# Patient Record
Sex: Male | Born: 1955 | Hispanic: No | Marital: Married | Smoking: Never smoker
Health system: Southern US, Community
[De-identification: ages and names within clinical notes are randomized; demographics above are authoritative.]

## PROBLEM LIST (undated history)

## (undated) DIAGNOSIS — B72 Dracunculiasis: Secondary | ICD-10-CM

## (undated) DIAGNOSIS — I10 Essential (primary) hypertension: Secondary | ICD-10-CM

## (undated) DIAGNOSIS — M545 Low back pain, unspecified: Secondary | ICD-10-CM

## (undated) HISTORY — DX: Essential (primary) hypertension: I10

## (undated) HISTORY — DX: Low back pain: M54.5

## (undated) HISTORY — DX: Low back pain, unspecified: M54.50

## (undated) HISTORY — DX: Dracunculiasis: B72

---

## 1993-09-10 HISTORY — PX: BREAST SURGERY: SHX581

## 2005-09-10 HISTORY — PX: APPENDECTOMY: SHX54

## 2006-10-05 ENCOUNTER — Ambulatory Visit (HOSPITAL_COMMUNITY): Admission: EM | Admit: 2006-10-05 | Discharge: 2006-10-06 | Payer: Self-pay | Admitting: Emergency Medicine

## 2011-10-15 ENCOUNTER — Encounter (HOSPITAL_COMMUNITY): Payer: Self-pay | Admitting: *Deleted

## 2011-10-15 ENCOUNTER — Emergency Department (HOSPITAL_COMMUNITY)
Admission: EM | Admit: 2011-10-15 | Discharge: 2011-10-15 | Disposition: A | Discharge status: Home / Self Care | Payer: Self-pay | Attending: Emergency Medicine | Admitting: Emergency Medicine

## 2011-10-15 ENCOUNTER — Emergency Department (HOSPITAL_COMMUNITY): Payer: Self-pay

## 2011-10-15 DIAGNOSIS — M25519 Pain in unspecified shoulder: Secondary | ICD-10-CM | POA: Insufficient documentation

## 2011-10-15 DIAGNOSIS — M545 Low back pain, unspecified: Secondary | ICD-10-CM | POA: Insufficient documentation

## 2011-10-15 DIAGNOSIS — M5380 Other specified dorsopathies, site unspecified: Secondary | ICD-10-CM | POA: Insufficient documentation

## 2011-10-15 MED ORDER — HYDROCODONE-ACETAMINOPHEN 5-325 MG PO TABS: 2.0000 | ORAL_TABLET | Freq: Every evening | ORAL | Status: AC | PRN

## 2011-10-15 MED ORDER — IBUPROFEN 800 MG PO TABS
800.0000 mg | ORAL_TABLET | Freq: Once | ORAL | Status: AC
  Administered 2011-10-15: 800 mg via ORAL
  Filled 2011-10-15: qty 1

## 2011-10-15 MED ORDER — IBUPROFEN 600 MG PO TABS: 600.0000 mg | ORAL_TABLET | Freq: Four times a day (QID) | ORAL | Status: AC | PRN

## 2011-10-15 NOTE — ED Provider Notes (Signed)
History     CSN: [id number]  Arrival date & time 10/15/11  1146   First MD Initiated Contact with Patient 10/15/11 1209      Chief Complaint  Patient presents with  . Shoulder Pain  . Back Pain    (Consider location/radiation/quality/duration/timing/severity/associated sxs/prior treatment) HPI  Patient presents to the emergency department with complaints of left shoulder pain since September and lumbar pain since September. The patient denies any injury or history of shoulder and lower back problems. The patient states that it does not hurt all the time but it mainly hurts at night when he is trying to go to sleep. Patient was given 600 mg of ibuprofen in the ED and upon my examination he states that the pain is mostly gone. The patient has not tried anything at home prior to arrival. The patient denies weakness, loss of sensation, inability to feel in both arms. Patient denies neck pain, bowel incontinence and urinary incontinence.  History reviewed. No pertinent past medical history.  Past Surgical History  Procedure Date  . Breast surgery   . Appendectomy     No family history on file.  History  Substance Use Topics  . Smoking status: Never Smoker   . Smokeless tobacco: Not on file  . Alcohol Use: No      Review of Systems  All other systems reviewed and are negative.    Allergies  Review of patient's allergies indicates no known allergies.  Home Medications   Current Outpatient Rx  Name Route Sig Dispense Refill  . NAPROXEN SODIUM 220 MG PO TABS Oral Take 220 mg by mouth 2 (two) times daily with a meal. pain    . HYDROCODONE-ACETAMINOPHEN 5-325 MG PO TABS Oral Take 2 tablets by mouth at bedtime as needed for pain. 6 tablet 0  . IBUPROFEN 600 MG PO TABS Oral Take 1 tablet (600 mg total) by mouth every 6 (six) hours as needed for pain. 30 tablet 0    BP 137/85  Pulse 61  Temp(Src) 98.2 F (36.8 C) (Oral)  Resp 16  SpO2 100%  Physical Exam    Constitutional: He is oriented to person, place, and time. He appears well-developed and well-nourished.  HENT:  Head: Normocephalic and atraumatic.  Eyes: EOM are normal. Pupils are equal, round, and reactive to light.  Neck: Normal range of motion.  Cardiovascular: Normal rate and regular rhythm.   Pulmonary/Chest: Effort normal and breath sounds normal.  Musculoskeletal: He exhibits tenderness.       Left shoulder: He exhibits decreased range of motion (due to pain) and pain. He exhibits no tenderness, no bony tenderness, no swelling, no effusion, no crepitus, no deformity, no laceration, no spasm, normal pulse and normal strength.       Lumbar back: He exhibits spasm. He exhibits normal range of motion, no tenderness, no bony tenderness, no swelling, no edema, no deformity, no laceration, no pain and normal pulse.  Neurological: He is alert and oriented to person, place, and time.  Skin: Skin is warm and dry.    ED Course  Procedures (including critical care time)  Labs Reviewed - No data to display Dg Lumbar Spine Complete  10/15/2011  *RADIOLOGY REPORT*  Clinical Data: Low back pain  LUMBAR SPINE - COMPLETE 4+ VIEW  Comparison: None  Findings: Osseous demineralization. Five non-rib bearing lumbar vertebrae. Scattered end plate spur formation and minimal disc space narrowing. No acute fracture, subluxation or bone destruction. No spondylolysis. SI joints symmetric. Subcutaneous soft  tissue calcification identified at the lower left lumbar region.  IMPRESSION: Mild scattered degenerative disc disease changes lumbar spine. No acute abnormalities.  Original Report Authenticated By: Lollie Marrow, M.D.   Dg Shoulder Left  10/15/2011  *RADIOLOGY REPORT*  Clinical Data: Low back pain, left shoulder pain  LEFT SHOULDER - 2+ VIEW  Comparison: None  Findings: AC joint alignment normal. Osseous demineralization. No acute fracture, dislocation, or bone destruction. Visualized left ribs intact.   IMPRESSION: Osseous demineralization. No acute abnormalities.  Original Report Authenticated By: Lollie Marrow, M.D.     1. Shoulder pain   2. Lumbar pain       MDM  Pts symptoms most likely due to musculloskeletal pain and arthritis. Will give patient a referral to Ortho and Motrin for during the pain and Lortab for at night.        Dorthula Matas, PA 10/15/11 [phone]

## 2011-10-15 NOTE — ED Notes (Signed)
Pt reports left shoulder pain since September. Denies known injury.

## 2011-10-16 NOTE — ED Provider Notes (Signed)
Medical screening examination/treatment/procedure(s) were performed by non-physician practitioner and as supervising physician I was immediately available for consultation/collaboration.   Laray Anger, DO 10/16/11 1003

## 2012-09-03 IMAGING — CR DG SHOULDER 2+V*L*
3 series · 3 of 3 positions shown · non-contrast
Comparison: None

CLINICAL DATA: Low back pain, left shoulder pain

LEFT SHOULDER - 2+ VIEW

[w shoulder external left]
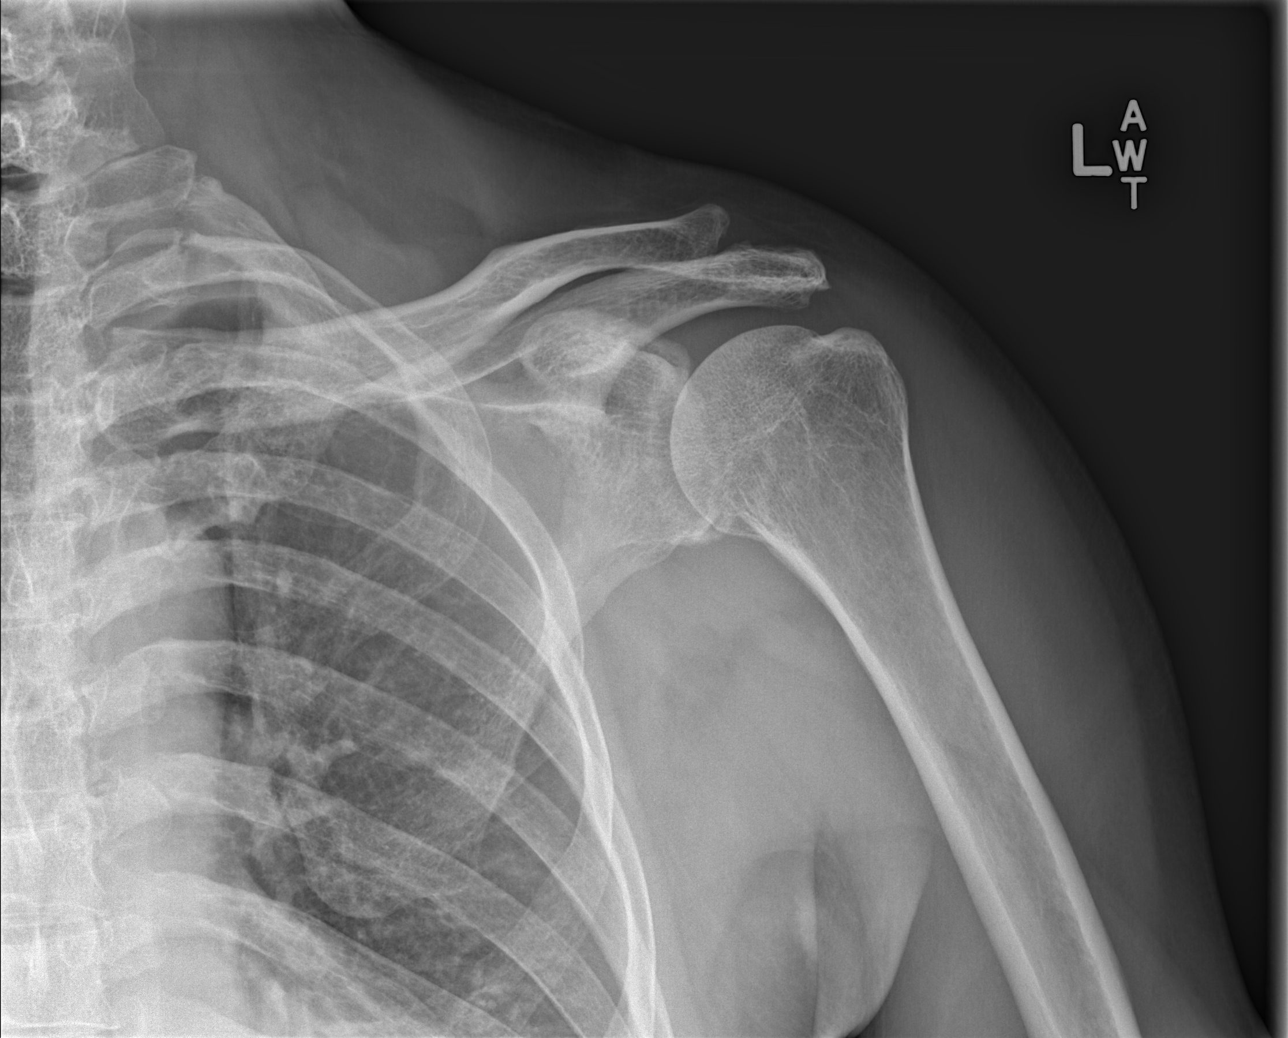

[w shoulder y-view left (1 of 2)]
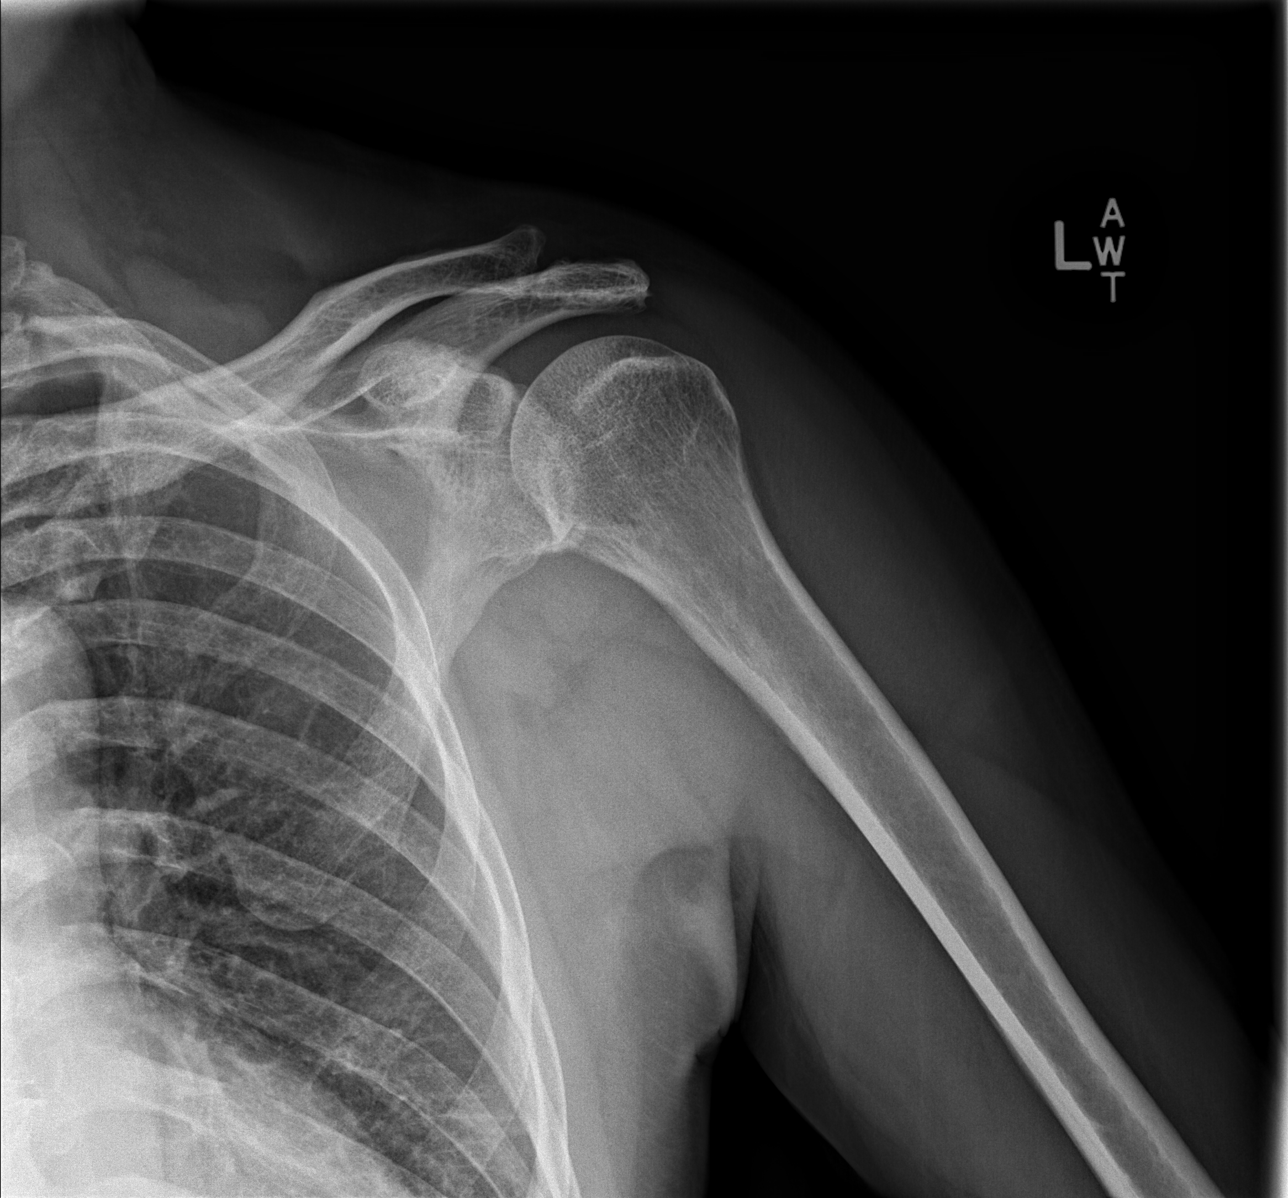

[w shoulder y-view left (2 of 2)]
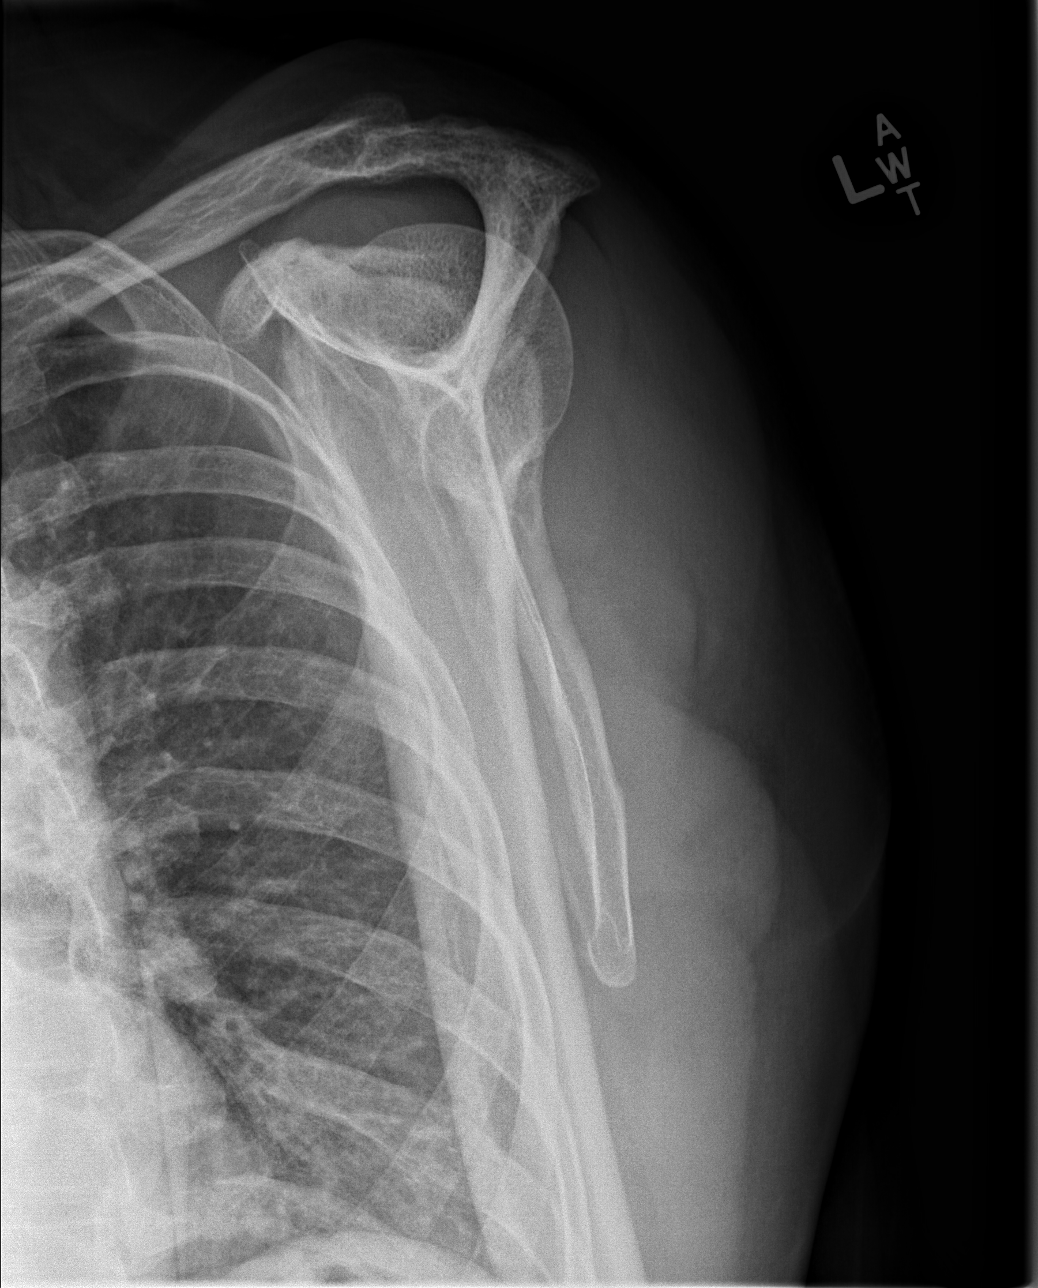

[3 of 3 positions shown; findings below may reference images not displayed]

FINDINGS: AC joint alignment normal.
Osseous demineralization.
No acute fracture, dislocation, or bone destruction.
Visualized left ribs intact.
IMPRESSION: Osseous demineralization.
No acute abnormalities.

## 2013-02-07 ENCOUNTER — Encounter (HOSPITAL_COMMUNITY): Payer: Self-pay | Admitting: Emergency Medicine

## 2013-02-07 ENCOUNTER — Emergency Department (HOSPITAL_COMMUNITY)
Admission: EM | Admit: 2013-02-07 | Discharge: 2013-02-07 | Disposition: A | Discharge status: Home / Self Care | Payer: Self-pay | Source: Home / Self Care

## 2013-02-07 DIAGNOSIS — J309 Allergic rhinitis, unspecified: Secondary | ICD-10-CM

## 2013-02-07 DIAGNOSIS — J020 Streptococcal pharyngitis: Secondary | ICD-10-CM

## 2013-02-07 LAB — POCT RAPID STREP A: Streptococcus, Group A Screen (Direct): POSITIVE — AB

## 2013-02-07 MED ORDER — FEXOFENADINE HCL 180 MG PO TABS: 180.0000 mg | ORAL_TABLET | Freq: Every day | ORAL | Status: DC

## 2013-02-07 MED ORDER — AMOXICILLIN 500 MG PO CAPS: 500.0000 mg | ORAL_CAPSULE | Freq: Three times a day (TID) | ORAL | Status: DC

## 2013-02-07 NOTE — ED Notes (Signed)
Pt c/o joint pain and weakness, headache, dry nonproductive cough.  Symptoms started on Friday. Pt denies fever, n/v/d.  Pt has been taking benedryl with no relief in symptoms.

## 2013-02-07 NOTE — ED Provider Notes (Signed)
History     CSN: [id number]  Arrival date & time 02/07/13  1146   None     Chief Complaint  Patient presents with  . Joint Pain    joint pain and fatigue    (Consider location/radiation/quality/duration/timing/severity/associated sxs/prior treatment) HPI Comments: 57 year old male presents with complaints of headache, sore throat, upper respiratory/ nasal congestion, PND, or and a weakness in the muscles and joints. He denies joint pain or swelling. Denies fever, chills, rash or tick bites that anytime. Symptoms began yesterday morning.   History reviewed. No pertinent past medical history.  Past Surgical History  Procedure Laterality Date  . Breast surgery    . Appendectomy      History reviewed. No pertinent family history.  History  Substance Use Topics  . Smoking status: Never Smoker   . Smokeless tobacco: Not on file  . Alcohol Use: No      Review of Systems  Constitutional: Positive for activity change and fatigue. Negative for fever, diaphoresis and appetite change.  HENT: Positive for congestion, sore throat, rhinorrhea and postnasal drip. Negative for ear pain, facial swelling, trouble swallowing, neck pain and neck stiffness.   Eyes: Negative for pain, discharge and redness.  Respiratory: Positive for cough. Negative for chest tightness and shortness of breath.   Cardiovascular: Negative.   Gastrointestinal: Negative.   Musculoskeletal: Negative.   Skin: Negative.  Negative for rash.  Neurological: Negative.     Allergies  Review of patient's allergies indicates no known allergies.  Home Medications   Current Outpatient Rx  Name  Route  Sig  Dispense  Refill  . amoxicillin (AMOXIL) 500 MG capsule   Oral   Take 1 capsule (500 mg total) by mouth 3 (three) times daily.   21 capsule   0   . fexofenadine (ALLEGRA) 180 MG tablet   Oral   Take 1 tablet (180 mg total) by mouth daily.   14 tablet   0   . naproxen sodium (ANAPROX) 220 MG tablet  Oral   Take 220 mg by mouth 2 (two) times daily with a meal. pain           BP 124/75  Pulse 67  Temp(Src) 98.7 F (37.1 C) (Oral)  Resp 18  SpO2 100%  Physical Exam  Nursing note and vitals reviewed. Constitutional: He is oriented to person, place, and time. He appears well-developed and well-nourished. No distress.  HENT:  Bilateral TMs are normal with the exception of mild retraction of the left TM. Oropharynx mildly red with clear PND. No exudates.  Eyes: Conjunctivae and EOM are normal.  Neck: Normal range of motion. Neck supple.  Cardiovascular: Normal rate, regular rhythm and normal heart sounds.   No murmur heard. Pulmonary/Chest: Effort normal and breath sounds normal. No respiratory distress. He has no wheezes. He has no rales.  Abdominal: Soft. There is no tenderness.  Musculoskeletal: Normal range of motion. He exhibits no edema.  No joint tenderness. No joint swelling.  Lymphadenopathy:    He has no cervical adenopathy.  Neurological: He is alert and oriented to person, place, and time. He exhibits normal muscle tone.  Skin: Skin is warm and dry. No rash noted.  Psychiatric: He has a normal mood and affect.    ED Course  Procedures (including critical care time)  Labs Reviewed  POCT RAPID STREP A ([facility] CARE ONLY) - Abnormal; Notable for the following:    Streptococcus, Group A Screen (Direct) POSITIVE (*)  All other components within normal limits   No results found.   1. Strep pharyngitis   2. Allergic rhinitis due to allergen       MDM  Amoxicillin 500 mg 3 times a day for 7 days Use Cepacol lozenges or Chloraseptic spray for discomfort Ibuprofen 600 mg every 6 hours when necessary pain Allegra 180 mg daily for allergies Drink plenty of fluids and stay well hydrated. Recheck promptly for any new symptoms problems or worsening. May follow up with your PCP next week if needed.       Hayden Rasmussen, NP 02/07/13 1301

## 2013-02-08 NOTE — ED Provider Notes (Signed)
Medical screening examination/treatment/procedure(s) were performed by resident physician or non-physician practitioner and as supervising physician I was immediately available for consultation/collaboration.   Lyfe Monger DOUGLAS MD.   Arrion Broaddus D Mikahla Wisor, MD 02/08/13 1844 

## 2014-09-10 DIAGNOSIS — R7303 Prediabetes: Secondary | ICD-10-CM | POA: Insufficient documentation

## 2014-09-10 HISTORY — DX: Prediabetes: R73.03

## 2014-09-20 ENCOUNTER — Ambulatory Visit: Payer: Self-pay | Admitting: Internal Medicine

## 2014-09-28 ENCOUNTER — Encounter: Payer: Self-pay | Admitting: Internal Medicine

## 2014-09-28 ENCOUNTER — Ambulatory Visit: Payer: Self-pay | Attending: Internal Medicine | Admitting: Internal Medicine

## 2014-09-28 VITALS — BP 138/81 | HR 54 | Temp 98.2°F | Resp 16 | Ht 71.0 in | Wt 176.0 lb

## 2014-09-28 DIAGNOSIS — M545 Low back pain: Secondary | ICD-10-CM | POA: Insufficient documentation

## 2014-09-28 DIAGNOSIS — R5383 Other fatigue: Secondary | ICD-10-CM | POA: Insufficient documentation

## 2014-09-28 DIAGNOSIS — R1084 Generalized abdominal pain: Secondary | ICD-10-CM

## 2014-09-28 DIAGNOSIS — Z2821 Immunization not carried out because of patient refusal: Secondary | ICD-10-CM

## 2014-09-28 MED ORDER — IBUPROFEN 600 MG PO TABS: 600.0000 mg | ORAL_TABLET | Freq: Three times a day (TID) | ORAL | Status: DC | PRN

## 2014-09-28 NOTE — Progress Notes (Signed)
Pt is here to establish care. Pt wants to address his occasional sharp/burning pain  in his back and ribs. He states that the pain sometimes makes it hard to breath.

## 2014-09-28 NOTE — Patient Instructions (Signed)

## 2014-09-28 NOTE — Progress Notes (Signed)
Patient ID: Joshua Calhoun Kucinski, male   DOB: 12-02-1955, 59 y.o.   MRN: [id number]  HQI:[id number]:[id number]  XLK:[id number]  DOB - 12-02-1955  CC:  Chief Complaint  Patient presents with  . Establish Care       HPI: Joshua Calhoun Rude is a 59 y.o. male here today to establish medical care.  Patient has no past medical history. He reports chronic back pain since the age of 59.  He denies any previous injury. The pain is located around his waist, more severe on the left side.  He states that when the pain is severe it radiates to his abdomen, up his back, and up the ribs that is aggravated by deep inspiration and bending over.  The pain is is achy and burning in nature.It  He denies nausea, vomiting, bowel/bladder dysfunction, fever, dysuria.   No Known Allergies History reviewed. No pertinent past medical history. Current Outpatient Prescriptions on File Prior to Visit  Medication Sig Dispense Refill  . naproxen sodium (ANAPROX) 220 MG tablet Take 220 mg by mouth 2 (two) times daily with a meal. pain    . amoxicillin (AMOXIL) 500 MG capsule Take 1 capsule (500 mg total) by mouth 3 (three) times daily. (Patient not taking: Reported on 09/28/2014) 21 capsule 0  . fexofenadine (ALLEGRA) 180 MG tablet Take 1 tablet (180 mg total) by mouth daily. (Patient not taking: Reported on 09/28/2014) 14 tablet 0   No current facility-administered medications on file prior to visit.   History reviewed. No pertinent family history. History   Social History  . Marital Status: Single    Spouse Name: N/A    Number of Children: N/A  . Years of Education: N/A   Occupational History  . Not on file.   Social History Main Topics  . Smoking status: Never Smoker   . Smokeless tobacco: Not on file  . Alcohol Use: No  . Drug Use: No  . Sexual Activity: Yes    Birth Control/ Protection: Condom   Other Topics Concern  . Not on file   Social History Narrative    Review of Systems  HENT: Negative.   Eyes:  Negative.   Respiratory: Negative.   Cardiovascular: Negative.   Gastrointestinal: Positive for abdominal pain. Negative for heartburn, nausea, vomiting and constipation.  Musculoskeletal: Positive for back pain.  Neurological: Negative.   Psychiatric/Behavioral: Negative.       Objective:   Filed Vitals:   09/28/14 1414  BP: 138/81  Pulse: 54  Temp: 98.2 F (36.8 C)  Resp: 16    Physical Exam  Constitutional: He is oriented to person, place, and time.  Cardiovascular: Normal rate, regular rhythm and normal heart sounds.   Pulmonary/Chest: Effort normal and breath sounds normal.  Abdominal: Soft. Bowel sounds are normal. He exhibits no distension. There is no tenderness.  Musculoskeletal: Normal range of motion. He exhibits tenderness (left paraspinal and waist).  Neurological: He is alert and oriented to person, place, and time.  Skin: Skin is warm and dry.   .  No results found for: WBC, HGB, HCT, MCV, PLT No results found for: CREATININE, BUN, NA, K, CL, CO2  No results found for: HGBA1C Lipid Panel  No results found for: CHOL, TRIG, HDL, CHOLHDL, VLDL, LDLCALC     Assessment and plan:   Thelma BargeFrancis was seen today for establish care.  Diagnoses and associated orders for this visit:  Low back pain without sciatica, unspecified back pain laterality/Generalized abdominal pain - ibuprofen (ADVIL,MOTRIN) 600 MG tablet;  Take 1 tablet (600 mg total) by mouth every 8 (eight) hours as needed. If pain persist we will do lumbar xray Explained signs and symptoms that should warrant immediate attention.  Patient verbalized understanding with teach back used.  Other fatigue - Lipid panel; Future - CBC; Future - COMPLETE METABOLIC PANEL WITH GFR; Future - TSH; Future - Hemoglobin A1C; Future  Refused influenza vaccine Explained that annual influenza is recommended per CDC guidelines and is highly suggested to anyone who has has CHF, COPD, DM or immunocompromised. Benefits  of influenza described in detail.    Return soon for blood work.    Holland Commons, NP-C [facility] [phone] 09/28/2014, 2:22 PM

## 2014-10-04 ENCOUNTER — Ambulatory Visit: Payer: No Typology Code available for payment source | Attending: Internal Medicine

## 2014-10-04 DIAGNOSIS — R5383 Other fatigue: Secondary | ICD-10-CM

## 2014-10-04 LAB — COMPLETE METABOLIC PANEL WITH GFR
ALK PHOS: 66 U/L (ref 39–117)
ALT: 14 U/L (ref 0–53)
AST: 20 U/L (ref 0–37)
Albumin: 4.1 g/dL (ref 3.5–5.2)
BUN: 14 mg/dL (ref 6–23)
CO2: 27 mEq/L (ref 19–32)
Calcium: 9.3 mg/dL (ref 8.4–10.5)
Chloride: 107 mEq/L (ref 96–112)
Creat: 1.05 mg/dL (ref 0.50–1.35)
GFR, Est African American: >89 mL/min
GFR, Est Non African American: 78 mL/min
Glucose, Bld: 103 mg/dL — ABNORMAL HIGH (ref 70–99)
Potassium: 5.4 mEq/L — ABNORMAL HIGH (ref 3.5–5.3)
SODIUM: 146 meq/L — AB (ref 135–145)
Total Bilirubin: 1.1 mg/dL (ref 0.2–1.2)
Total Protein: 6.9 g/dL (ref 6.0–8.3)

## 2014-10-04 LAB — LIPID PANEL
CHOL/HDL RATIO: 3.3 ratio
CHOLESTEROL: 173 mg/dL (ref 0–200)
HDL: 53 mg/dL (ref >39)
LDL CALC: 104 mg/dL — AB (ref 0–99)
TRIGLYCERIDES: 78 mg/dL (ref <150)
VLDL: 16 mg/dL (ref 0–40)

## 2014-10-04 LAB — CBC
HEMATOCRIT: 42.7 % (ref 39.0–52.0)
Hemoglobin: 13.6 g/dL (ref 13.0–17.0)
MCH: 29.6 pg (ref 26.0–34.0)
MCHC: 31.9 g/dL (ref 30.0–36.0)
MCV: 93 fL (ref 78.0–100.0)
MPV: 9.8 fL (ref 8.6–12.4)
Platelets: 235 10*3/uL (ref 150–400)
RBC: 4.59 MIL/uL (ref 4.22–5.81)
RDW: 13.2 % (ref 11.5–15.5)
WBC: 3.8 10*3/uL — ABNORMAL LOW (ref 4.0–10.5)

## 2014-10-04 LAB — TSH: TSH: 1.303 u[IU]/mL (ref 0.350–4.500)

## 2014-10-04 LAB — HEMOGLOBIN A1C
Hgb A1c MFr Bld: 5.9 % — ABNORMAL HIGH (ref <5.7)
MEAN PLASMA GLUCOSE: 123 mg/dL — AB (ref <117)

## 2014-10-12 ENCOUNTER — Telehealth: Payer: Self-pay | Admitting: Emergency Medicine

## 2014-10-12 ENCOUNTER — Telehealth: Payer: Self-pay | Admitting: Internal Medicine

## 2014-10-12 NOTE — Telephone Encounter (Signed)
Pt is in waiting room, he is here to inquire about her results.

## 2014-10-12 NOTE — Telephone Encounter (Signed)
Pt requesting lab results Please post

## 2014-10-14 ENCOUNTER — Telehealth: Payer: Self-pay | Admitting: Emergency Medicine

## 2014-10-14 NOTE — Telephone Encounter (Signed)
Left message for pt to call for lab results 

## 2014-10-15 ENCOUNTER — Telehealth: Payer: Self-pay | Admitting: Emergency Medicine

## 2014-10-15 NOTE — Telephone Encounter (Signed)
Pt given lab results with diet/exercise education

## 2015-06-20 ENCOUNTER — Encounter: Payer: Self-pay | Admitting: Internal Medicine

## 2015-06-20 ENCOUNTER — Ambulatory Visit (INDEPENDENT_AMBULATORY_CARE_PROVIDER_SITE_OTHER): Payer: Self-pay | Admitting: Internal Medicine

## 2015-06-20 VITALS — BP 128/84 | HR 72 | Ht 69.0 in | Wt 180.0 lb

## 2015-06-20 DIAGNOSIS — B86 Scabies: Secondary | ICD-10-CM

## 2015-06-20 MED ORDER — PERMETHRIN 5 % EX CREA: TOPICAL_CREAM | CUTANEOUS | Status: DC

## 2015-06-20 MED ORDER — TRIAMCINOLONE ACETONIDE 0.1 % EX CREA
TOPICAL_CREAM | CUTANEOUS | Status: DC
Start: 2015-06-20 — End: 2015-07-18

## 2015-06-20 NOTE — Patient Instructions (Addendum)
At bedtime, after obtaining medication: Cover skin from top of head to bottoms of feet with lotion. In morning:  Wash clothes in hot water that have been worn since rash, sheets, blankets and other bedclothes.  Dry in dryer on high. Then shower off medication. Repeat again in 1 week with lotion and washing bedclothes/clothes. Call [phone] and see if can be seen at [facility] STD clinic to get this.

## 2015-06-20 NOTE — Progress Notes (Signed)
   Subjective:    Patient ID: Joshua Calhoun, male    DOB: Aug 03, 1956, 59 y.o.   MRN: [id number]  HPI  States had non pruritic rash all over his body in 2010/2011.  Ultimately, seen by Dermatologist within [facility] system.  Reportedly, no definitive diagnosis even with biopsies of skin.   Was treated with unknown cream from [facility] at [facility]  States he used the cream twice daily for about 1 month and the rash resolved. Unable to find anything regarding this in patient's chart.  Pt. Started with a different rash on forearms in August of this year.  This rash is pruritic.  Starts as little bumps and then scratches.  Has hyperpigmented areas where he has scratched lesions in past. Now on upper back, arms and left leg--mainly lower leg. Has been using anti itch cream--"Triple - Action".  No real improvement. Has itching in webbing of hands, but has not noted any lesions. LIves alone.  Denies sleeping/having intercourse with anyone since 2007. 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 No associated fever or other concern.      Review of Systems     Objective:   Physical Exam  Constitutional: He appears well-developed and well-nourished.  Eyes: EOM are normal. Pupils are equal, round, and reactive to light.  Neck: Neck supple.  No cervical adenopathy  Cardiovascular: Normal rate and regular rhythm.  Exam reveals no friction rub.   Abdominal: Soft. Bowel sounds are normal. He exhibits no mass.  No HSM  Musculoskeletal: Normal range of motion.  Skin: Skin is warm and dry.  Papular rash with newer lesions, possible some burrows, but difficult to tell as has scratched most of the older lesions until significant scarring and hyperpigmentation. Lesions most prominent on forearms and entire length of legs, possibly some resolving lesions in interdigital areas. Also noted  on upper back and at anterior waistline.          Assessment & Plan:  1.  Possible scabies: Unable to get patient affordable Rx for Permethrin 5% topical at local pharmacies.  Spoke with PHD.  Pt. Can be seen in STD clinic for fill of medication for free/low cost. Pt. Sent with instructions to PHD to have this done.

## 2015-07-18 ENCOUNTER — Encounter: Payer: Self-pay | Admitting: Internal Medicine

## 2015-07-18 ENCOUNTER — Ambulatory Visit (INDEPENDENT_AMBULATORY_CARE_PROVIDER_SITE_OTHER): Payer: Self-pay | Admitting: Internal Medicine

## 2015-07-18 VITALS — BP 138/88 | HR 60 | Ht 69.0 in | Wt 181.0 lb

## 2015-07-18 DIAGNOSIS — L42 Pityriasis rosea: Secondary | ICD-10-CM

## 2015-07-18 MED ORDER — TRIAMCINOLONE ACETONIDE 0.1 % EX OINT: 1.0000 "application " | TOPICAL_OINTMENT | Freq: Two times a day (BID) | CUTANEOUS | Status: DC

## 2015-07-18 NOTE — Progress Notes (Signed)
   Subjective:    Patient ID: Joshua Calhoun, male    DOB: 1956/05/08, 59 y.o.   MRN: [id number]  HPI  Pt. Stopped in last week and stated his rash was not really better after 2 treatments, one week apart for scabies.  The lesions on his arms and legs were more pruritic.  Did not have a chance to take a good look at his entire body for rash.   Today, he states, he feels the rash is waning, though still pruritic at times.  Otherwise, feels fine:  No fever, joint complaint, abdominal complaint.    Review of Systems     Objective:   Physical Exam Ovoid lesions with long axis along skin dermatomes giving Christmas Tree appearance on back.  Somewhat similarly on chest/abdomen.   Lesions very hyperpigmented and raised, but flat on arms and legs, but patient admits to scratching these.  The lesions on arms and legs appear to be the same as when seen here last week. Has scattered tiny papular lesions in clusters mainly on trunk, especially back.         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  No definite Herald Patch. Pt. To return if no improvement over next month--discussed waxing and waning nature of rash, especially if he gets hot. Continue with Triamcinolone as necessary for itch.  Cool soaks as well.

## 2015-07-18 NOTE — Patient Instructions (Signed)
Keep bathing in cool temps. Use Triamcinolone for lesions twice daily and then Vaseline all over. Call if no improvement in 1 month or if should worsen at any time.

## 2015-07-26 ENCOUNTER — Telehealth: Payer: Self-pay | Admitting: Internal Medicine

## 2015-07-26 NOTE — Telephone Encounter (Signed)
Patient is returning your call.  

## 2015-07-26 NOTE — Telephone Encounter (Signed)
Did not call patient. They are not a patient in this office.

## 2015-08-15 ENCOUNTER — Other Ambulatory Visit (INDEPENDENT_AMBULATORY_CARE_PROVIDER_SITE_OTHER): Payer: Self-pay

## 2015-08-15 DIAGNOSIS — R21 Rash and other nonspecific skin eruption: Secondary | ICD-10-CM

## 2015-08-16 LAB — RPR QUALITATIVE: RPR: NONREACTIVE

## 2015-08-17 ENCOUNTER — Other Ambulatory Visit: Payer: Self-pay | Admitting: Internal Medicine

## 2017-05-31 ENCOUNTER — Ambulatory Visit: Payer: Self-pay | Admitting: Internal Medicine

## 2017-06-11 ENCOUNTER — Ambulatory Visit (INDEPENDENT_AMBULATORY_CARE_PROVIDER_SITE_OTHER): Payer: Self-pay | Admitting: Internal Medicine

## 2017-06-11 ENCOUNTER — Encounter: Payer: Self-pay | Admitting: Internal Medicine

## 2017-06-11 VITALS — BP 142/80 | HR 68 | Resp 12 | Ht 68.25 in | Wt 189.0 lb

## 2017-06-11 DIAGNOSIS — K029 Dental caries, unspecified: Secondary | ICD-10-CM

## 2017-06-11 DIAGNOSIS — K5900 Constipation, unspecified: Secondary | ICD-10-CM

## 2017-06-11 DIAGNOSIS — R1084 Generalized abdominal pain: Secondary | ICD-10-CM

## 2017-06-11 DIAGNOSIS — G8929 Other chronic pain: Secondary | ICD-10-CM

## 2017-06-11 DIAGNOSIS — M5440 Lumbago with sciatica, unspecified side: Secondary | ICD-10-CM

## 2017-06-11 DIAGNOSIS — M545 Low back pain, unspecified: Secondary | ICD-10-CM | POA: Insufficient documentation

## 2017-06-11 DIAGNOSIS — R030 Elevated blood-pressure reading, without diagnosis of hypertension: Secondary | ICD-10-CM

## 2017-06-11 DIAGNOSIS — Z23 Encounter for immunization: Secondary | ICD-10-CM

## 2017-06-11 MED ORDER — IBUPROFEN 600 MG PO TABS: ORAL_TABLET | ORAL | 0 refills | Status: DC

## 2017-06-11 NOTE — Progress Notes (Signed)
Subjective:    Patient ID: Joshua Calhoun, male    DOB: 12/02/1955, 61 y.o.   MRN: [id number]  HPI      1. Right low back pain:  Has had since he was a young man (age 49 yo).  The pain comes and goes.  Radiating down right leg/lateral thigh. Has had an exacerbation for past 3 months.  No numbness, tingling or weakness in the right leg. Has had evaluated before with xray in 2013--showed mild DDD of lumbar spine.  Complained of left sided pain in the past--he states the pain can switch side. Has never had PT for treatment in the past. Does not keep him from usual daily activities.  "does everything he wants to do." Has taken Aleve without much relief.   2.  Elevated BP:  Denies history of high blood pressure  3.  Constipation:  Has had for 2 days.  No change in diet.  Does not eat a lot of fruits and vegetables currently.  Drinks about 3 bottles of water daily. States he is physically active. 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  Not clear what this is about--not a job.  Current Meds  Medication Sig  . [DISCONTINUED] naproxen sodium (ANAPROX) 220 MG tablet Take 220 mg by mouth 2 (two) times daily with a meal.    No Known Allergies   Past Medical History:  Diagnosis Date  . Low back pain 61 yo    Past Surgical History:  Procedure Laterality Date  . APPENDECTOMY Right 2007  . BREAST SURGERY Bilateral 1995   Developed breasts as teenager.  Had removed in [place] when age 79 yo    Family History  Problem Relation Age of Onset  . Alcohol abuse Brother     Social History   Social History  . Marital status: Legally Separated    Spouse name: N/A  . Number of children: 0  . Years of education: 12th grade   Occupational History  . 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   Social History Main Topics  . Smoking status: Never Smoker  . Smokeless tobacco: Never Used  . Alcohol use 0.0 oz/week     Comment: Rare glass of wine--states he itches  after drinking, so does not use very often.  . Drug use: No  . Sexual activity: Not Currently    Birth control/ protection: Condom     Comment: Not since 2007   Other Topics Concern  . Not on file   Social History Narrative   Originally from [place]   Taught in elementary school--all subjects.     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   Came to [place] In [place] originally as a tourist   Lives with a "new wife"  In [place].   Divorced from wife in [place]             Review of Systems     Objective:   Physical Exam  NAD HEENT: PERRL, EOMI, multiple teeth missing.  caried teeth broken off at gums.  Multiple cavities. Neck:  Supple, No adenopathy Chest:  CTA CV:  RRR with normal S1 and S2, No S3, S4 or murmur.  Radial pulses normal and equal Back:  NT over spinous processes or paraspinous musculature.   Neuro:  LE:  DTRs 2+/4 and Motor 5/5 throughout.  Sensory grossly normal to light touch.        Assessment & Plan:  1.  Chronic recurrent low back pain:  Ibuprofen 600 mg twice daily with food as needed for pain.   PT referral to [facility] PT clinic.  2.  Constipation:  Discussed improving fiber and fluid intake daily.  3.  Dental Decay:  Dental referral.  4.  Mildly Elevated BP:  Follow for now.  5.  HM:  Tdap today  Followup in 3 months.

## 2017-06-13 NOTE — Progress Notes (Signed)
Referral, OV notes and demographics faxed to [facility] pro bono clinic. Facility will contact patient to schedule appointment.

## 2017-09-11 ENCOUNTER — Ambulatory Visit: Payer: Self-pay | Admitting: Internal Medicine

## 2018-03-07 ENCOUNTER — Other Ambulatory Visit: Payer: Self-pay

## 2018-03-07 ENCOUNTER — Emergency Department (HOSPITAL_COMMUNITY)
Admission: EM | Admit: 2018-03-07 | Discharge: 2018-03-07 | Disposition: A | Discharge status: Home / Self Care | Payer: Self-pay | Attending: Emergency Medicine | Admitting: Emergency Medicine

## 2018-03-07 ENCOUNTER — Encounter (HOSPITAL_COMMUNITY): Payer: Self-pay | Admitting: Emergency Medicine

## 2018-03-07 DIAGNOSIS — Z79899 Other long term (current) drug therapy: Secondary | ICD-10-CM | POA: Insufficient documentation

## 2018-03-07 DIAGNOSIS — R42 Dizziness and giddiness: Secondary | ICD-10-CM

## 2018-03-07 LAB — BASIC METABOLIC PANEL
Anion gap: 5 (ref 5–15)
BUN: 16 mg/dL (ref 8–23)
CHLORIDE: 106 mmol/L (ref 98–111)
CO2: 31 mmol/L (ref 22–32)
Calcium: 8.9 mg/dL (ref 8.9–10.3)
Creatinine, Ser: 1.09 mg/dL (ref 0.61–1.24)
GFR calc non Af Amer: >60 mL/min (ref >60)
Glucose, Bld: 90 mg/dL (ref 70–99)
Potassium: 4 mmol/L (ref 3.5–5.1)
SODIUM: 142 mmol/L (ref 135–145)

## 2018-03-07 LAB — CBC
HCT: 43.7 % (ref 39.0–52.0)
HEMOGLOBIN: 13.7 g/dL (ref 13.0–17.0)
MCH: 30 pg (ref 26.0–34.0)
MCHC: 31.4 g/dL (ref 30.0–36.0)
MCV: 95.6 fL (ref 78.0–100.0)
Platelets: 194 10*3/uL (ref 150–400)
RBC: 4.57 MIL/uL (ref 4.22–5.81)
RDW: 12.7 % (ref 11.5–15.5)
WBC: 4.7 10*3/uL (ref 4.0–10.5)

## 2018-03-07 LAB — URINALYSIS, ROUTINE W REFLEX MICROSCOPIC
Bilirubin Urine: NEGATIVE
GLUCOSE, UA: NEGATIVE mg/dL
Hgb urine dipstick: NEGATIVE
Ketones, ur: NEGATIVE mg/dL
LEUKOCYTES UA: NEGATIVE
Nitrite: NEGATIVE
PH: 5 (ref 5.0–8.0)
Protein, ur: NEGATIVE mg/dL
SPECIFIC GRAVITY, URINE: 1.019 (ref 1.005–1.030)

## 2018-03-07 LAB — CBG MONITORING, ED: Glucose-Capillary: 106 mg/dL — ABNORMAL HIGH (ref 70–99)

## 2018-03-07 MED ORDER — SODIUM CHLORIDE 0.9 % IV BOLUS
1000.0000 mL | Freq: Once | INTRAVENOUS | Status: AC
  Administered 2018-03-07: 1000 mL via INTRAVENOUS

## 2018-03-07 NOTE — ED Provider Notes (Signed)
[facility]-EMERGENCY DEPT Provider Note   CSN: [id number] Arrival date & time: 03/07/18  1135     History   Chief Complaint Chief Complaint  Patient presents with  . Dizziness    HPI Joshua Calhoun is a 62 y.o. male.  Joshua Calhoun is a 62 y.o. Male history of chronic low back pain, presents to the emergency department for evaluation of " dizziness when the son is out".  Patient reports since Monday when he is been outside he felt intermittently dizzy.  He describes this feeling a little bit lightheaded like he might pass out, patient has not passed out with the symptoms.  He reports he only experienced these the symptoms when he is out in the hot sun, when he is indoors or in the shade this does not occur.  He denies any associated chest pain or shortness of breath, no palpitations.  He denies sensation of room spinning or being off balance, no associated headaches, vision changes, weakness numbness or tingling.  He does report feeling somewhat fatigued.  Patient has not done anything to treat the symptoms prior to arrival.  Has not followed up with his primary care doctor regarding this.  He denies any associated abdominal pain, nausea, vomiting, melena or hematochezia, no urinary symptoms, no cough, no fevers, no URI symptoms.     Past Medical History:  Diagnosis Date  . Low back pain 62 yo    Patient Active Problem List   Diagnosis Date Noted  . Low back pain     Past Surgical History:  Procedure Laterality Date  . APPENDECTOMY Right 2007  . BREAST SURGERY Bilateral 1995   Developed breasts as teenager.  Had removed in [place] when age 53 yo        Home Medications    Prior to Admission medications   Medication Sig Start Date End Date Taking? Authorizing Provider  Ascorbic Acid (VITAMIN C PO) Take 1 tablet by mouth daily.   Yes [provider]  Cyanocobalamin (B-12 PO) Take 1 tablet by mouth daily.   Yes [provider]    ibuprofen (ADVIL,MOTRIN) 600 MG tablet 1 tab by mouth twice daily with meals as needed for pain Patient not taking: Reported on 03/07/2018 06/11/17   Julieanne Manson, MD    Family History Family History  Problem Relation Age of Onset  . Alcohol abuse Brother     Social History Social History   Tobacco Use  . Smoking status: Never Smoker  . Smokeless tobacco: Never Used  Substance Use Topics  . Alcohol use: Yes    Alcohol/week: 0.0 oz    Comment: Rare glass of wine--states he itches after drinking, so does not use very often.  . Drug use: No     Allergies   Patient has no known allergies.   Review of Systems Review of Systems  Constitutional: Negative for chills and fever.  HENT: Negative for congestion, rhinorrhea and sore throat.   Eyes: Negative for visual disturbance.  Respiratory: Negative for cough, chest tightness, shortness of breath and wheezing.   Cardiovascular: Negative for chest pain, palpitations and leg swelling.  Gastrointestinal: Negative for abdominal pain, nausea and vomiting.  Genitourinary: Negative for dysuria and frequency.  Musculoskeletal: Negative for arthralgias and joint swelling.  Skin: Negative for color change and rash.  Neurological: Positive for light-headedness. Negative for dizziness, syncope, weakness, numbness and headaches.     Physical Exam Updated Vital Signs BP 102/84   Pulse 71  Temp 98.1 F (36.7 C) (Oral)   Resp 14   SpO2 98%   Physical Exam  Constitutional: He is oriented to person, place, and time. He appears well-developed and well-nourished. No distress.  HENT:  Head: Normocephalic and atraumatic.  Mouth/Throat: Oropharynx is clear and moist.  Eyes: Pupils are equal, round, and reactive to light. EOM are normal. Right eye exhibits no discharge. Left eye exhibits no discharge.  Neck: Normal range of motion. Neck supple.  Cardiovascular: Normal rate, regular rhythm, normal heart sounds and intact distal  pulses.  Pulmonary/Chest: Effort normal and breath sounds normal. No stridor. No respiratory distress. He has no wheezes. He has no rales.  Respirations equal and unlabored, patient able to speak in full sentences, lungs clear to auscultation bilaterally  Abdominal: Soft. Bowel sounds are normal. He exhibits no distension and no mass. There is no tenderness. There is no guarding.  Abdomen soft, nondistended, nontender to palpation in all quadrants without guarding or peritoneal signs  Musculoskeletal: He exhibits no edema or deformity.  Neurological: He is alert and oriented to person, place, and time. Coordination normal.  Speech is clear, able to follow commands CN III-XII intact Normal strength in upper and lower extremities bilaterally including dorsiflexion and plantar flexion, strong and equal grip strength Sensation normal to light and sharp touch Moves extremities without ataxia, coordination intact Normal finger to nose and rapid alternating movements No pronator drift  Skin: Skin is warm. He is not diaphoretic.  Psychiatric: He has a normal mood and affect. His behavior is normal.  Nursing note and vitals reviewed.    ED Treatments / Results  Labs (all labs ordered are listed, but only abnormal results are displayed) Labs Reviewed  CBG MONITORING, ED - Abnormal; Notable for the following components:      Result Value   Glucose-Capillary 106 (*)    All other components within normal limits  BASIC METABOLIC PANEL  CBC  URINALYSIS, ROUTINE W REFLEX MICROSCOPIC    EKG EKG Interpretation  Date/Time:  Friday March 07 2018 11:48:37 EDT Ventricular Rate:  58 PR Interval:    QRS Duration: 80 QT Interval:  397 QTC Calculation: 390 R Axis:   -5 Text Interpretation:  Sinus rhythm Ventricular premature complex , new since last tracing Probable left atrial enlargement Abnormal R-wave progression, early transition , new since last tracing Borderline T wave abnormalities Confirmed  by Linwood DibblesKnapp, Jon [phone]([id number]) on 03/07/2018 11:58:21 AM   Radiology No results found.  Procedures Procedures (including critical care time)  Medications Ordered in ED Medications  sodium chloride 0.9 % bolus 1,000 mL (1,000 mLs Intravenous New Bag/Given 03/07/18 1414)     Initial Impression / Assessment and Plan / ED Course  I have reviewed the triage vital signs and the nursing notes.  Pertinent labs & imaging results that were available during my care of the patient were reviewed by me and considered in my medical decision making (see chart for details).  Patient presents for evaluation of lightheadedness when he is out in the heat, no associated syncope, chest pain, shortness of breath, palpitations, no abdominal pain, nausea or vomiting, no headache, dizziness, or vision changes.  No fevers or infectious symptoms.  Patient's vitals normal, he does appear slightly dehydrated, basic labs obtained in triage.  On my evaluation patient is well-appearing and in no acute distress.  No focal findings on exam.  Patient is not orthostatic.  1 L fluid bolus provided.  EKG without concerning ischemic changes.  No leukocytosis,  normal hemoglobin, no acute electrolyte derangements, normal kidney function, urinalysis without signs of infection.  Patient ambulatory here in the department without any dizziness or lightheadedness.  At this time I feel he stable for discharge home with close follow-up with his primary care doctor.  Return precautions discussed.  Patient expresses understanding and agreement with plan.  Final Clinical Impressions(s) / ED Diagnoses   Final diagnoses:  Lightheadedness    ED Discharge Orders    None       Legrand Rams 03/07/18 1512    Shaune Pollack, MD 03/07/18 [phone]

## 2018-03-07 NOTE — Discharge Instructions (Signed)
Your evaluation today is very reassuring, lab work and EKG look good.  Your symptoms could potentially be caused by dehydration, please make sure you are drinking at least eight 8 ounce glasses of water per day, more if you are working outside.  Follow-up appointment with your primary care doctor.  Return to the emergency department if you have worsening or more persistent symptoms, if you pass out, develop chest pain, shortness of breath, palpitations or any other new or concerning symptoms.

## 2018-03-07 NOTE — ED Triage Notes (Signed)
Pt complaint of dizziness "when the sun is hot" since Monday; denies numbness, tingling, or weakness.

## 2018-03-20 ENCOUNTER — Encounter: Payer: Self-pay | Admitting: Internal Medicine

## 2018-03-20 ENCOUNTER — Ambulatory Visit: Payer: Self-pay | Admitting: Internal Medicine

## 2018-03-20 VITALS — BP 142/90 | HR 60 | Resp 12 | Ht 68.25 in | Wt 192.0 lb

## 2018-03-20 DIAGNOSIS — R42 Dizziness and giddiness: Secondary | ICD-10-CM

## 2018-03-20 DIAGNOSIS — M5440 Lumbago with sciatica, unspecified side: Secondary | ICD-10-CM

## 2018-03-20 DIAGNOSIS — R030 Elevated blood-pressure reading, without diagnosis of hypertension: Secondary | ICD-10-CM

## 2018-03-20 DIAGNOSIS — Z79899 Other long term (current) drug therapy: Secondary | ICD-10-CM

## 2018-03-20 DIAGNOSIS — B351 Tinea unguium: Secondary | ICD-10-CM

## 2018-03-20 DIAGNOSIS — G8929 Other chronic pain: Secondary | ICD-10-CM

## 2018-03-20 DIAGNOSIS — B353 Tinea pedis: Secondary | ICD-10-CM

## 2018-03-20 DIAGNOSIS — R221 Localized swelling, mass and lump, neck: Secondary | ICD-10-CM

## 2018-03-20 MED ORDER — TERBINAFINE HCL 250 MG PO TABS: ORAL_TABLET | ORAL | 0 refills | Status: DC

## 2018-03-20 NOTE — Patient Instructions (Signed)
For foot and toenail fungus:  Spray your shoes with Lysol or Lotrimin Antifungal spray when you start the medication for your feet.   Re-spray your the shoes you wear with each wearing and allow to dry before wearing again You will need to continue to spray the shoes you have during the infection of your toenails and feet have been thrown out due to wear.  Once your toenails and feet are clear of infection, the shoes you buy new do not necessarily need to be sprayed. Clean your shower floor once to twice daily with bleach containing cleaner. 

## 2018-03-20 NOTE — Progress Notes (Signed)
Subjective:    Patient ID: Joshua Calhoun, male    DOB: 30-Sep-1955, 62 y.o.   MRN: [id number]  HPI   1.  Dizziness with being out in the sun  03/07/18, for which he went to ED.  Labs, ECG, etc were all unremarkable.  He has not had any problems since the ED visit.   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    2.  Low back pain:  Was only able to get to PT once due to the distance to the office.  He states the pain changes areas.  He is doing stretches over a ball with his back.   He did get an exercise routine to do at home.  Not clear how often he does them.    3.  Elevated BP:  States he heard from home today that his adult daughter was ill with some form of enlarged heart disease.  He feels he is just a bit anxious regarding this.  His BP in the ED was quite good.  4.  Skin peeling on his feet and hands.  No itching.  He states this has been going on for about 1 year.  Has been using cocoa butter cream.  Not much change with this treatment.  5.  Had complete physical with labs about 3 weeks ago for green card.  He would rather give [place] the records he has rather than have [place] send for the records.  Not clear why.  6.  End of visit:  Brings up a soft mass he has on low right neck/anterior chest. States not painful.  He has noted it there for maybe a year, but was not big to start.  Sounds like has gradually increased in size.   No oral/dental/ear or scalp concerns on the right side.  Current Meds  Medication Sig  . Ascorbic Acid (VITAMIN C PO) Take 1 tablet by mouth daily.  . Cyanocobalamin (B-12 PO) Take 1 tablet by mouth daily.   No Known Allergies  Review of Systems     Objective:   Physical Exam  NAD HEENT:  PERRL, EOMI, discs sharp, TMs pearly gray, throat without injection Neck:  Supple, No anterior cervical adenopathy, no thyromegaly. 5 cm by 4 cm irregular cluster of subcutaneous soft tissue masses.   Lesion most inferior and anterior of the cluster in the shape of a large lima bean.  Cluster is soft and not fixed.  No supraclavicular adenopathy Chest:  CTA CV:  RRR with normal S1 and S2, No S3 S4 or murmur.  No carotid bruits. Carotid, radial and DP pulses normal and equal Abd:  S, NT, No HSM or mass, + BS No axillary or inguinal adenopathy. MS: mild L/S paraspinous muscle tenderness.  Great and 2 other toenails on each foot with thickening, crumbling and dark discoloration.  Thick plaques of flaking and thickening scattered over plantar aspect of feet and medial ankles.  Similar lesions on hands, but no obvious nail involvement.    Assessment & Plan:  1.  Recent ED for Dizziness:  Encouraged patient to keep hydrated when working in warm conditions.  Also to call clinic in future and try to be seen here first.  2.  Recent CPE and bloodwork--he will bring in his records.  3.  Elevated BP:  Will have him return for repeat bp check when in for toenails in 6 weeks--this has happened  before.  4.  Tinea Pedis/toenail onychomycosis:  Hepatic profile baseline--not done in ED recently.  Terbinafine 250 mg daily for 84 days.  Shoe and shower floor care discussed.  5.  Right anterior neck/chest mass:  Referral to ENT.  Will let them decide which radiologic exam would best assess.  Dr. Suszanne Connerseoh. Patient to apply for financial assistance with [facility] as well.  6.  Chronic back pain: to continue exercises.  Does not seem to be particularly bothersome to patient currently.

## 2018-03-21 LAB — HEPATIC FUNCTION PANEL
ALK PHOS: 74 IU/L (ref 39–117)
ALT: 33 IU/L (ref 0–44)
AST: 26 IU/L (ref 0–40)
Albumin: 4.4 g/dL (ref 3.6–4.8)
BILIRUBIN, DIRECT: 0.19 mg/dL (ref 0.00–0.40)
Bilirubin Total: 0.7 mg/dL (ref 0.0–1.2)
Total Protein: 7.5 g/dL (ref 6.0–8.5)

## 2018-05-02 ENCOUNTER — Ambulatory Visit: Payer: Self-pay | Admitting: Internal Medicine

## 2018-05-05 ENCOUNTER — Ambulatory Visit: Payer: Self-pay | Admitting: Internal Medicine

## 2018-05-05 ENCOUNTER — Encounter: Payer: Self-pay | Admitting: Internal Medicine

## 2018-05-05 VITALS — BP 122/72 | HR 62 | Resp 12 | Ht 68.25 in | Wt 189.0 lb

## 2018-05-05 DIAGNOSIS — B353 Tinea pedis: Secondary | ICD-10-CM

## 2018-05-05 DIAGNOSIS — R030 Elevated blood-pressure reading, without diagnosis of hypertension: Secondary | ICD-10-CM

## 2018-05-05 DIAGNOSIS — B351 Tinea unguium: Secondary | ICD-10-CM

## 2018-05-05 DIAGNOSIS — Z79899 Other long term (current) drug therapy: Secondary | ICD-10-CM

## 2018-05-05 DIAGNOSIS — R221 Localized swelling, mass and lump, neck: Secondary | ICD-10-CM

## 2018-05-05 NOTE — Progress Notes (Signed)
   Subjective:    Patient ID: Alfredo BachFrancis Simoneaux, male    DOB: 1956-01-01, 62 y.o.   MRN: [id number]  HPI   1.  Elevated BP:  bp much improved and in normal range.    2.  Neck Mass:  Has not heard anything regarding referral to ENT  3.  Tinea Pedis and onychomycosis:  Started Terbinafine 7/11.  No problems with meds.    Current Meds  Medication Sig  . Ascorbic Acid (VITAMIN C PO) Take 1 tablet by mouth daily.  . Cyanocobalamin (B-12 PO) Take 1 tablet by mouth daily.  Marland Kitchen. terbinafine (LAMISIL) 250 MG tablet 1 tab by mouth for 84 days.    No Known Allergies Review of Systems     Objective:   Physical Exam NAD Mass in right low neck just above medial clavicle without change Abd:  S NT, No HSM.  + BS Skin: palmar and plantar aspects of hands and feet without thickening and flaking currently.  Toenails still without much in way of change, perhaps a small sliver of normal nail at base of great toenails bilaterally      Assessment & Plan:  1.  Elevated BP:  Normalized.  2.  Right neck/upper supraclavicular mass:  Checking on what happened with referral to Dr. Suszanne Connerseoh, ENT.  3.  Toenail onychomycosis and palmar/plantar tinea:  Much improved with Terbinafine.  Complete course and follow up in 6 weeks.  Nails with minimal change at this point.

## 2018-05-06 LAB — HEPATIC FUNCTION PANEL
ALK PHOS: 76 IU/L (ref 39–117)
ALT: 18 IU/L (ref 0–44)
AST: 19 IU/L (ref 0–40)
Albumin: 4.4 g/dL (ref 3.6–4.8)
Bilirubin Total: 0.6 mg/dL (ref 0.0–1.2)
Bilirubin, Direct: 0.13 mg/dL (ref 0.00–0.40)
TOTAL PROTEIN: 7.2 g/dL (ref 6.0–8.5)

## 2018-06-13 ENCOUNTER — Ambulatory Visit (INDEPENDENT_AMBULATORY_CARE_PROVIDER_SITE_OTHER): Payer: Self-pay | Admitting: Internal Medicine

## 2018-06-13 ENCOUNTER — Encounter: Payer: Self-pay | Admitting: Internal Medicine

## 2018-06-13 VITALS — BP 122/78 | HR 60 | Resp 12 | Ht 68.25 in | Wt 190.0 lb

## 2018-06-13 DIAGNOSIS — B351 Tinea unguium: Secondary | ICD-10-CM

## 2018-06-13 DIAGNOSIS — R221 Localized swelling, mass and lump, neck: Secondary | ICD-10-CM

## 2018-06-13 DIAGNOSIS — B353 Tinea pedis: Secondary | ICD-10-CM | POA: Insufficient documentation

## 2018-06-13 NOTE — Progress Notes (Signed)
   Subjective:    Patient ID: Joshua Calhoun, male    DOB: 08-16-1956, 62 y.o.   MRN: [id number]  HPI   1.  Right low neck mass:  He cancelled his appt made end of 22-May-2023 as his 66 yo daughter died in [place] from an enlarged heart.  He does not know anymore as to what the cause was.  Sounds like she died of heart failure.  2.  Toenails and fingernails:  Hands and feet without any flaking as before on palmar and plantar surfaces.  Fingernails look clear.  Toenails still with thickening and discoloration though one great toenail clearing at the base, but he thinks this is just how his nails are.      Review of Systems     Objective:   Physical Exam  NAD Mass at base of anterior right neck without change. Fingernails without abnormality Palms without flaking or white discoloration. Feet without flaking of plantar aspect.  Toenails are still thickened and discolored.  Base of the great toenail possibly with mild clearing.      Assessment & Plan:  1.  Palmar and plantar tinea:  Appears resolved with Terbinafine.  2.  Toenail onychomycosis:  Not clear if much improved follow for now.  3.  Right anterior neck mass:  Reappointed with ENT toward end of October.

## 2018-06-13 NOTE — Patient Instructions (Signed)
Free flu vaccine/covered flu vaccine (bring insurance card if you have one) at flu vaccine clinics:   [place] card sign up in October 17th for 2 hours  between 8:30 a.m. and 11:30 a.m . 

## 2018-06-18 ENCOUNTER — Telehealth: Payer: Self-pay

## 2018-06-18 NOTE — Telephone Encounter (Signed)
Spoke with patient. States he can not make appointment with ENT dur to location. States he needs something local and appointment Is scheduled for [place] location due to patient not having any insurance. I informed patient we had discussed this last week in regards to if location would be a issue and he stated it would not be a issue. Patient informed Dr. Delrae Alfred that he would be able to make appointment. Patient states he does not recall having that conversation with Dr. Delrae Alfred. Patient was given number to call and reschedule.  Per dr. Delrae Alfred we will not be rescheduling this appointment for patient again.

## 2018-09-10 DIAGNOSIS — R972 Elevated prostate specific antigen [PSA]: Secondary | ICD-10-CM

## 2018-09-10 HISTORY — DX: Elevated prostate specific antigen (PSA): R97.20

## 2018-10-05 ENCOUNTER — Encounter: Payer: Self-pay | Admitting: Internal Medicine

## 2018-10-14 ENCOUNTER — Encounter: Payer: Self-pay | Admitting: Internal Medicine

## 2018-10-14 ENCOUNTER — Ambulatory Visit: Payer: Self-pay | Admitting: Internal Medicine

## 2018-10-14 VITALS — BP 138/88 | HR 68 | Resp 12 | Ht 68.25 in | Wt 195.0 lb

## 2018-10-14 DIAGNOSIS — R221 Localized swelling, mass and lump, neck: Secondary | ICD-10-CM

## 2018-10-14 DIAGNOSIS — Z0000 Encounter for general adult medical examination without abnormal findings: Secondary | ICD-10-CM

## 2018-10-14 NOTE — Progress Notes (Signed)
Subjective:    Patient ID: Joshua Calhoun, male   DOB: 1956/02/04, 63 y.o.   MRN: [id number]   HPI    Here for Male CPE:  1.  STE: He does check, no findings.  No family history of testicular cancer.   2.  PSA/DRE: Feels he had this done in past, but cannot remember when.  DRE in past 2 years.  Normal.  3.  Guaiac Cards:  Never.    4.  Colonoscopy: Never.  No family history of colon cancer.  5.  Cholesterol/Glucose:  No history of hypercholesterolemia or hyperglycemia.   Lipid Panel     Component Value Date/Time   CHOL 173 10/04/2014 0953   TRIG 78 10/04/2014 0953   HDL 53 10/04/2014 0953   CHOLHDL 3.3 10/04/2014 0953   VLDL 16 10/04/2014 0953   LDLCALC 104 (H) 10/04/2014 0953    6.  Immunizations:  Immunization History  Administered Date(s) Administered  . Influenza Inj Mdck Quad Pf 06/30/2018  . Tdap 06/11/2017     No outpatient medications have been marked as taking for the 10/14/18 encounter (Office Visit) with Julieanne Manson, MD.   No Known Allergies  Past Medical History:  Diagnosis Date  . Low back pain 63 yo    Past Surgical History:  Procedure Laterality Date  . APPENDECTOMY Right 2007  . BREAST SURGERY Bilateral 1995   Developed breasts as teenager.  Had removed in [place] when age 2 yo    Family History  Problem Relation Age of Onset  . Alcohol abuse Brother   . Heart disease Daughter 45       MI    Social History   Socioeconomic History  . Marital status: Legally Separated    Spouse name: Not on file  . Number of children: 1  . Years of education: 12th grade  . Highest education level: 12th grade  Occupational History  . 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  Social Needs  . Financial resource strain: Not on file  . Food insecurity:    Worry: Never true    Inability: Never true  . Transportation needs:    Medical: No    Non-medical: No  Tobacco Use  . Smoking status: Never  Smoker  . Smokeless tobacco: Never Used  Substance and Sexual Activity  . Alcohol use: Yes    Alcohol/week: 0.0 standard drinks    Comment: Rare glass of wine--states he itches after drinking, so does not use very often.  . Drug use: No  . Sexual activity: Not Currently    Birth control/protection: Condom    Comment: Not since 2007  Lifestyle  . Physical activity:    Days per week: 0 days    Minutes per session: 0 min  . Stress: Not on file  Relationships  . Social connections:    Talks on phone: More than three times a week    Gets together: Twice a week    Attends religious service: More than 4 times per year    Active member of club or organization: No    Attends meetings of clubs or organizations: Never    Relationship status: Separated  . Intimate partner violence:    Fear of current or ex partner: No    Emotionally abused: No    Physically abused: No    Forced sexual activity: No  Other Topics Concern  . Not on file  Social History Narrative  Originally from [place]   Taught in elementary school--all subjects.     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   Came [place] In [place] originally as a tourist   Lives with a "new wife"  In [place].   The two of them live together   Divorced from wife in [place]             Review of Systems  Constitutional: Negative for appetite change, fatigue and fever.  HENT: Positive for postnasal drip, sinus pain and sore throat. Negative for dental problem, ear pain and hearing loss.   Eyes: Negative for visual disturbance.  Respiratory: Negative for cough and shortness of breath.   Cardiovascular: Negative for chest pain, palpitations and leg swelling.  Gastrointestinal: Negative for abdominal pain, blood in stool (No melena), constipation and diarrhea.  Genitourinary: Negative for dysuria and frequency (Maybe incomplete emptying).       Nocturia 3-4 times at night.  Musculoskeletal: Negative for arthralgias.  Skin:  Negative for rash.  Neurological: Negative for weakness and numbness.  Hematological: Does not bruise/bleed easily.  Psychiatric/Behavioral: Negative for dysphoric mood. The patient is not nervous/anxious.       Objective:   BP 138/88 (BP Location: Left Arm, Patient Position: Sitting, Cuff Size: Normal)   Pulse 68   Resp 12   Ht 5' 8.25" (1.734 m)   Wt 195 lb (88.5 kg)   BMI 29.43 kg/m   Physical Exam  Constitutional: He is oriented to person, place, and time. He appears well-developed and well-nourished.  HENT:  Head: Normocephalic and atraumatic.  Right Ear: Hearing, tympanic membrane, external ear and ear canal normal.  Left Ear: Hearing, tympanic membrane, external ear and ear canal normal.  Nose: Nose normal.  Mouth/Throat: Uvula is midline, oropharynx is clear and moist and mucous membranes are normal.  Eyes: Pupils are equal, round, and reactive to light. Conjunctivae and EOM are normal.  Discs sharp bilaterally.  Neck: Normal range of motion and full passive range of motion without pain. Neck supple. No thyromegaly present.  Fatty well circumscribed, but irregular mass about 4-5 cm in diameter at base of anterior right neck.  Cardiovascular: Normal rate, regular rhythm, S1 normal and S2 normal. Exam reveals no S3, no S4 and no friction rub.  No murmur heard. No carotid bruits.  Carotid, radial, femoral, DP and PT pulses normal and equal.   Pulmonary/Chest: Effort normal and breath sounds normal.  Abdominal: Soft. Bowel sounds are normal. He exhibits no mass. There is no hepatosplenomegaly. There is no abdominal tenderness. No hernia.  Genitourinary:    Genitourinary Comments: Refused GU/DRE   Musculoskeletal: Normal range of motion.  Lymphadenopathy:       Head (right side): No submental and no submandibular adenopathy present.       Head (left side): No submental and no submandibular adenopathy present.    He has no cervical adenopathy.    He has no axillary  adenopathy.       Right: No supraclavicular adenopathy present.       Left: No supraclavicular adenopathy present.  Neurological: He is alert and oriented to person, place, and time. He has normal strength and normal reflexes. No cranial nerve deficit. Coordination and gait normal.  Skin: Skin is warm. No rash noted.  Dry skin about nail beds.  Psychiatric: He has a normal mood and affect. His speech is normal and behavior is normal. Judgment and thought content normal. Impulsive: 1.  Assessment & Plan   1.  CPE Patient declined GU/DR Exam Return in 2 weeks for fasting labs:  FLP, CBC, CMP, PSA, return guaiac cards then as well. Influenza and Tdap up to date.  2.  Right neck mass:  Likely a Lipoma, but patient has not obtained [facility] orange card for referral to ENT or consideration for ultrasound to evaluate.

## 2018-10-28 ENCOUNTER — Other Ambulatory Visit: Payer: Self-pay

## 2018-10-28 DIAGNOSIS — Z79899 Other long term (current) drug therapy: Secondary | ICD-10-CM

## 2018-10-28 DIAGNOSIS — Z1211 Encounter for screening for malignant neoplasm of colon: Secondary | ICD-10-CM

## 2018-10-28 DIAGNOSIS — Z13220 Encounter for screening for lipoid disorders: Secondary | ICD-10-CM

## 2018-10-28 DIAGNOSIS — R972 Elevated prostate specific antigen [PSA]: Secondary | ICD-10-CM

## 2018-10-28 DIAGNOSIS — Z125 Encounter for screening for malignant neoplasm of prostate: Secondary | ICD-10-CM

## 2018-10-28 NOTE — Addendum Note (Signed)
Addended by: Marcelino Freestone on: 10/28/2018 09:10 AM   Modules accepted: Orders

## 2018-10-29 LAB — COMPREHENSIVE METABOLIC PANEL
ALK PHOS: 87 IU/L (ref 39–117)
ALT: 30 IU/L (ref 0–44)
AST: 31 IU/L (ref 0–40)
Albumin/Globulin Ratio: 1.5 (ref 1.2–2.2)
Albumin: 4.5 g/dL (ref 3.8–4.8)
BUN/Creatinine Ratio: 7 — ABNORMAL LOW (ref 10–24)
BUN: 9 mg/dL (ref 8–27)
Bilirubin Total: 0.9 mg/dL (ref 0.0–1.2)
CALCIUM: 9.4 mg/dL (ref 8.6–10.2)
CO2: 29 mmol/L (ref 20–29)
CREATININE: 1.28 mg/dL — AB (ref 0.76–1.27)
Chloride: 104 mmol/L (ref 96–106)
GFR calc Af Amer: 69 mL/min/{1.73_m2} (ref >59)
GFR, EST NON AFRICAN AMERICAN: 60 mL/min/{1.73_m2} (ref >59)
GLOBULIN, TOTAL: 3.1 g/dL (ref 1.5–4.5)
GLUCOSE: 94 mg/dL (ref 65–99)
Potassium: 4.8 mmol/L (ref 3.5–5.2)
SODIUM: 143 mmol/L (ref 134–144)
Total Protein: 7.6 g/dL (ref 6.0–8.5)

## 2018-10-29 LAB — CBC WITH DIFFERENTIAL/PLATELET
BASOS ABS: 0.1 10*3/uL (ref 0.0–0.2)
Basos: 1 %
EOS (ABSOLUTE): 0.1 10*3/uL (ref 0.0–0.4)
EOS: 3 %
Hematocrit: 48 % (ref 37.5–51.0)
Hemoglobin: 15.3 g/dL (ref 13.0–17.7)
IMMATURE GRANULOCYTES: 0 %
Immature Grans (Abs): 0 10*3/uL (ref 0.0–0.1)
LYMPHS ABS: 1.9 10*3/uL (ref 0.7–3.1)
Lymphs: 50 %
MCH: 30.1 pg (ref 26.6–33.0)
MCHC: 31.9 g/dL (ref 31.5–35.7)
MCV: 95 fL (ref 79–97)
MONOS ABS: 0.4 10*3/uL (ref 0.1–0.9)
Monocytes: 11 %
Neutrophils Absolute: 1.3 10*3/uL — ABNORMAL LOW (ref 1.4–7.0)
Neutrophils: 35 %
PLATELETS: 221 10*3/uL (ref 150–450)
RBC: 5.08 x10E6/uL (ref 4.14–5.80)
RDW: 12.9 % (ref 11.6–15.4)
WBC: 3.9 10*3/uL (ref 3.4–10.8)

## 2018-10-29 LAB — LIPID PANEL W/O CHOL/HDL RATIO
Cholesterol, Total: 199 mg/dL (ref 100–199)
HDL: 48 mg/dL (ref >39)
LDL CALC: 134 mg/dL — AB (ref 0–99)
Triglycerides: 85 mg/dL (ref 0–149)
VLDL Cholesterol Cal: 17 mg/dL (ref 5–40)

## 2018-10-29 LAB — PSA: Prostate Specific Ag, Serum: 7 ng/mL — ABNORMAL HIGH (ref 0.0–4.0)

## 2018-10-29 NOTE — Progress Notes (Signed)
Elevated PSA with labs following CPE Referring to Urology

## 2018-10-29 NOTE — Addendum Note (Signed)
Addended by: Marcene Duos on: 10/29/2018 08:54 AM   Modules accepted: Orders

## 2018-11-12 ENCOUNTER — Other Ambulatory Visit: Payer: Self-pay

## 2021-01-02 ENCOUNTER — Telehealth: Payer: Self-pay | Admitting: Internal Medicine

## 2021-01-02 NOTE — Telephone Encounter (Signed)
Patient came to the office requesting an appointment. Patient stated that he's been having headache and stomachache since last two weeks. No other symptoms. Patient took Pepto Bismol once a day for two days, the first time helped a little and the second time did not made any difference. For headache pt. Took 2/200mg  tablets of advil and did helped relieve the headache. Please advise.

## 2021-01-06 NOTE — Telephone Encounter (Signed)
If he is still having the symptoms, please work him in sometime next week. If someone is asking for an appt for an acute problem, okay to go ahead and place on wait list

## 2021-01-09 NOTE — Telephone Encounter (Signed)
Patient was scheduled for an appointment on 01/12/2021

## 2021-01-09 NOTE — Telephone Encounter (Signed)
Called patient and left a message asking to call back to schedule an appointment for him

## 2021-01-12 ENCOUNTER — Other Ambulatory Visit: Payer: Self-pay

## 2021-01-12 ENCOUNTER — Encounter: Payer: Self-pay | Admitting: Internal Medicine

## 2021-01-12 ENCOUNTER — Ambulatory Visit: Payer: Self-pay | Admitting: Internal Medicine

## 2021-01-12 VITALS — BP 138/82 | HR 60 | Resp 12 | Ht 68.5 in | Wt 195.0 lb

## 2021-01-12 DIAGNOSIS — R972 Elevated prostate specific antigen [PSA]: Secondary | ICD-10-CM

## 2021-01-12 DIAGNOSIS — R112 Nausea with vomiting, unspecified: Secondary | ICD-10-CM

## 2021-01-12 NOTE — Progress Notes (Signed)
    Subjective:    Patient ID: Joshua Calhoun, male   DOB: 12-20-1955, 65 y.o.   MRN: [id number]   HPI   Here to re establish.  Has not been seen since 10/2018.  Diffused abdominal discomfort--something rolling in stomach and then nauseated and then vomits.  Describes having this for two days in a row separated by 2 weeks.   Last episode was 1 week ago this past Saturday (about 1 3/4 weeks ago) First episode also with diarrhea.  Also had headache with the other symptoms.  No fever.   No melena or hematochezia.   He did use both Advil for the headache and Pepto bismal for the stomach issues and calmed symptoms.   He has had no weight loss. His wife has not been ill nor have any co workers at the [employer] where he works.   Has felt fine since the last episode.   Appetite is fine.  BMs normal and without diarrhea or constipation.   No travel prior to feeling ill.   Does not recall eating anything suspicious prior to feeling ill.   He had an elevated PSA back in 2020 and never went to the urologist in follow up.  Referral made at beginning of the pandemic 10/29/2018.  Had Moderna booster on 09/19/2020, so not quite ready for second booster.  No outpatient medications have been marked as taking for the 01/12/21 encounter (Office Visit) with Julieanne Manson, MD.   No Known Allergies   Review of Systems    Objective:   BP 138/82 (BP Location: Right Arm, Patient Position: Sitting, Cuff Size: Normal)   Pulse 60   Resp 12   Ht 5' 8.5" (1.74 m)   Wt 195 lb (88.5 kg)   BMI 29.22 kg/m   Physical Exam  NAD HEENT:  PERRL, EOMI, conjunctivae clear Neck:  Supple, no adenopathy.  Soft tissue mass at anterior base of right neck without change, moveable. Chest:  CTA CV:  RRR with normal S1 and S2, No S3, S4 or murmur.  Normal radial and DP pulses. Abd:  S, NT, No HSM or mass, + BS LE:  No edema.  Assessment & Plan  1.  Two episodes of abdominal discomfort, N, V, HA, diarrhea:   No findings on exam.  He is at his baseline now.  Suspect was gastroenteritis--discussed Norovirus has been going around. CBC, CMP  2.  Elevated PSA history:  PSA today.  He needs to apply for orange card.  Referral to Urology re written.  Discussed it is importatnt he follow up and to call if he does not hear back.  3.  COvID vaccination:  To return after the 10th for Moderna, 2nd booster.

## 2021-01-13 LAB — COMPREHENSIVE METABOLIC PANEL
ALT: 17 IU/L (ref 0–44)
AST: 22 IU/L (ref 0–40)
Albumin/Globulin Ratio: 1.6 (ref 1.2–2.2)
Albumin: 4.5 g/dL (ref 3.8–4.8)
Alkaline Phosphatase: 92 IU/L (ref 44–121)
BUN/Creatinine Ratio: 9 — ABNORMAL LOW (ref 10–24)
BUN: 9 mg/dL (ref 8–27)
Bilirubin Total: 0.7 mg/dL (ref 0.0–1.2)
CO2: 25 mmol/L (ref 20–29)
Calcium: 8.8 mg/dL (ref 8.6–10.2)
Chloride: 105 mmol/L (ref 96–106)
Creatinine, Ser: 1.03 mg/dL (ref 0.76–1.27)
Globulin, Total: 2.8 g/dL (ref 1.5–4.5)
Glucose: 90 mg/dL (ref 65–99)
Potassium: 4.6 mmol/L (ref 3.5–5.2)
Sodium: 143 mmol/L (ref 134–144)
Total Protein: 7.3 g/dL (ref 6.0–8.5)
eGFR: 81 mL/min/{1.73_m2} (ref >59)

## 2021-01-13 LAB — CBC WITH DIFFERENTIAL/PLATELET
Basophils Absolute: 0 10*3/uL (ref 0.0–0.2)
Basos: 1 %
EOS (ABSOLUTE): 0.1 10*3/uL (ref 0.0–0.4)
Eos: 2 %
Hematocrit: 44.3 % (ref 37.5–51.0)
Hemoglobin: 14.8 g/dL (ref 13.0–17.7)
Immature Grans (Abs): 0 10*3/uL (ref 0.0–0.1)
Immature Granulocytes: 0 %
Lymphocytes Absolute: 1.9 10*3/uL (ref 0.7–3.1)
Lymphs: 45 %
MCH: 30.1 pg (ref 26.6–33.0)
MCHC: 33.4 g/dL (ref 31.5–35.7)
MCV: 90 fL (ref 79–97)
Monocytes Absolute: 0.4 10*3/uL (ref 0.1–0.9)
Monocytes: 10 %
Neutrophils Absolute: 1.7 10*3/uL (ref 1.4–7.0)
Neutrophils: 42 %
Platelets: 203 10*3/uL (ref 150–450)
RBC: 4.91 x10E6/uL (ref 4.14–5.80)
RDW: 12.5 % (ref 11.6–15.4)
WBC: 4.1 10*3/uL (ref 3.4–10.8)

## 2021-01-13 LAB — PSA: Prostate Specific Ag, Serum: 7.8 ng/mL — ABNORMAL HIGH (ref 0.0–4.0)

## 2022-08-07 ENCOUNTER — Ambulatory Visit: Payer: Self-pay | Admitting: Internal Medicine

## 2022-08-07 VITALS — BP 160/94 | HR 76 | Resp 16 | Ht 68.5 in | Wt 192.0 lb

## 2022-08-07 DIAGNOSIS — M545 Low back pain, unspecified: Secondary | ICD-10-CM

## 2022-08-07 DIAGNOSIS — R972 Elevated prostate specific antigen [PSA]: Secondary | ICD-10-CM

## 2022-08-07 DIAGNOSIS — Z13220 Encounter for screening for lipoid disorders: Secondary | ICD-10-CM

## 2022-08-07 DIAGNOSIS — Z23 Encounter for immunization: Secondary | ICD-10-CM

## 2022-08-07 NOTE — Patient Instructions (Signed)
Flu shot this Thursday--see flyer Recommend Pneumococcal 20, shingrix

## 2022-08-07 NOTE — Progress Notes (Signed)
Subjective:    Patient ID: Joshua Calhoun, male   DOB: 03/04/1956, 66 y.o.   MRN: [id number]   HPI Not seen for 1.5 years again.   Headaches:  bifrontal when weather changes to cold. Describes congestion in his nose associated with this  Has not tried allergy medications.  Sinuses do not burn "too much"   States generally has the headaches in the winter time.  Able to perform his ADLs.  More of a nagging headache.  States this has not changed for the 19 years [place] has come to [place]  2.  Low back pain:  Bilateral and sometime spreads up to mid thoracic area.  Generally, if doing heavy work--bending and picking heavy items up or if sits for prolonged periods of time.   Does not want to take medication for this unless hurts a lot.   3.  Itchy patches on skin.  Does use use a lotion, Cerave perhaps after showers.  Patches on LE and itchy.    4.  History of elevated PSA:  states he never put the paperwork together for the orange card.  Couldn't find some of the documents needed and so never able to get the referral to go through.  This is a problem since 2020.  Discussed options to go to [facility] or [facility], but still would need financial assistance application.  5.  HM:  Behind on lots of vaccines.  No outpatient medications have been marked as taking for the 08/07/22 encounter (Office Visit) with Julieanne Manson, MD.   No Known Allergies   Review of Systems    Objective:   BP (!) 160/94 (BP Location: Left Arm, Patient Position: Sitting, Cuff Size: Normal)   Pulse 76   Resp 16   Ht 5' 8.5" (1.74 m)   Wt 192 lb (87.1 kg)   BMI 28.77 kg/m   Physical Exam HEENT:  PERRL, EOMI, Discs sharp.  NT over frontal and Maxillary sinus areas.  TMs pearly gray, nasal mucosa boggy, right >Left with clear discharge.  Unable to see posterior pharynx well. Neck:  supple, No adenopathy Chest;  CTA CV:  RRR with normal S1 and S2, No S3, S4 or murmur.  No carotid bruit.  Carotid, radial , DP and  PT pulses normal and equal Abd:  S, NT, No HSM or mass, + BS Neuro:  A & O x 3, CN II-XII grossly intact.  Motor 5/5  and DTRs 2+/4 throughout.  Gait normal Back:  NT over spinous processes mild tenderness over LS paraspinous musculature.   Skin:  hyperpigmented flaking patches on lower legs bilaterally  Assessment & Plan    History of elevated PSA with multiple attempts since 2020 to send to Urology for further evaluation.  Today states just has been unable to get paperwork complete for orange card application, not that he does not meet criteria as he initially stated.  Discussed no matter where he is sent, including [facility] or [facility], he will still need to apply for financial assistance with similar paperwork.  Will have our MSW, Morene Antu work with him on this for orange card and see if able to expedite.  Recheck PSA and now free PSA.  Also check CBC, CMP  2.  Low back pain:  referral to [facility] for exercise program he can do at home.  3.  Headaches:  to try otc Claritin 10 mg daily or other 24 hour antihistamine daily  4.  HM:  Spikevax.  Encouraged him to obtain Pneumococcal 20, Shingrix at [facility].  Influenza vaccine at free clinic on Thursday of this week.

## 2022-08-08 ENCOUNTER — Telehealth: Payer: Self-pay | Admitting: Internal Medicine

## 2022-08-08 ENCOUNTER — Encounter: Payer: Self-pay | Admitting: Internal Medicine

## 2022-08-08 DIAGNOSIS — R972 Elevated prostate specific antigen [PSA]: Secondary | ICD-10-CM | POA: Insufficient documentation

## 2022-08-08 LAB — LIPID PANEL W/O CHOL/HDL RATIO
Cholesterol, Total: 170 mg/dL (ref 100–199)
HDL: 47 mg/dL (ref >39)
LDL Chol Calc (NIH): 108 mg/dL — ABNORMAL HIGH (ref 0–99)
Triglycerides: 78 mg/dL (ref 0–149)
VLDL Cholesterol Cal: 15 mg/dL (ref 5–40)

## 2022-08-08 LAB — COMPREHENSIVE METABOLIC PANEL
ALT: 18 IU/L (ref 0–44)
AST: 25 IU/L (ref 0–40)
Albumin/Globulin Ratio: 1.4 (ref 1.2–2.2)
Albumin: 4.3 g/dL (ref 3.9–4.9)
Alkaline Phosphatase: 79 IU/L (ref 44–121)
BUN/Creatinine Ratio: 10 (ref 10–24)
BUN: 11 mg/dL (ref 8–27)
Bilirubin Total: 0.6 mg/dL (ref 0.0–1.2)
CO2: 25 mmol/L (ref 20–29)
Calcium: 9.3 mg/dL (ref 8.6–10.2)
Chloride: 104 mmol/L (ref 96–106)
Creatinine, Ser: 1.05 mg/dL (ref 0.76–1.27)
Globulin, Total: 3.1 g/dL (ref 1.5–4.5)
Glucose: 87 mg/dL (ref 70–99)
Potassium: 4.2 mmol/L (ref 3.5–5.2)
Sodium: 142 mmol/L (ref 134–144)
Total Protein: 7.4 g/dL (ref 6.0–8.5)
eGFR: 78 mL/min/{1.73_m2} (ref >59)

## 2022-08-08 LAB — CBC WITH DIFFERENTIAL/PLATELET
Basophils Absolute: 0 10*3/uL (ref 0.0–0.2)
Basos: 1 %
EOS (ABSOLUTE): 0.1 10*3/uL (ref 0.0–0.4)
Eos: 1 %
Hematocrit: 42.7 % (ref 37.5–51.0)
Hemoglobin: 14 g/dL (ref 13.0–17.7)
Immature Grans (Abs): 0 10*3/uL (ref 0.0–0.1)
Immature Granulocytes: 0 %
Lymphocytes Absolute: 1.7 10*3/uL (ref 0.7–3.1)
Lymphs: 38 %
MCH: 30.2 pg (ref 26.6–33.0)
MCHC: 32.8 g/dL (ref 31.5–35.7)
MCV: 92 fL (ref 79–97)
Monocytes Absolute: 0.3 10*3/uL (ref 0.1–0.9)
Monocytes: 8 %
Neutrophils Absolute: 2.4 10*3/uL (ref 1.4–7.0)
Neutrophils: 52 %
Platelets: 202 10*3/uL (ref 150–450)
RBC: 4.63 x10E6/uL (ref 4.14–5.80)
RDW: 12.2 % (ref 11.6–15.4)
WBC: 4.5 10*3/uL (ref 3.4–10.8)

## 2022-08-08 LAB — PSA, TOTAL AND FREE
PSA, Free Pct: 9.3 %
PSA, Free: 1.54 ng/mL
Prostate Specific Ag, Serum: 16.5 ng/mL — ABNORMAL HIGH (ref 0.0–4.0)

## 2022-12-06 ENCOUNTER — Encounter: Payer: Self-pay | Admitting: Internal Medicine

## 2022-12-06 ENCOUNTER — Ambulatory Visit: Payer: Self-pay | Admitting: Internal Medicine

## 2022-12-06 VITALS — BP 156/96 | HR 72 | Resp 16 | Ht 68.5 in | Wt 195.0 lb

## 2022-12-06 DIAGNOSIS — I10 Essential (primary) hypertension: Secondary | ICD-10-CM

## 2022-12-06 DIAGNOSIS — Z599 Problem related to housing and economic circumstances, unspecified: Secondary | ICD-10-CM

## 2022-12-06 DIAGNOSIS — Z0000 Encounter for general adult medical examination without abnormal findings: Secondary | ICD-10-CM

## 2022-12-06 DIAGNOSIS — R972 Elevated prostate specific antigen [PSA]: Secondary | ICD-10-CM

## 2022-12-06 MED ORDER — LISINOPRIL 10 MG PO TABS: 10.0000 mg | ORAL_TABLET | Freq: Every day | ORAL | 11 refills | Status: DC

## 2022-12-06 NOTE — Progress Notes (Signed)
Subjective:    Patient ID: Joshua Calhoun, male   DOB: May 24, 1956, 66 y.o.   MRN: [id number]   HPI  Here for Male CPE:  1.  STE:  Does not perform.  No family history of testicular cancer.    2.  PSA:  Elevated in November.  Has been elevated and increasing since 2020.  Free percentage places him at higher likelihood of cancer.  He did not sign up for orange card again.  States the requirements to sign up are too rigorous.  Have attempted to send him 4 separate times since 2020.  Discussed all referrals would require applying for financial assistance.  3.  [place] Cards/FIT:  Has not returned in past.  4.  Colonoscopy:  never.  No family history of colon cancer.    5.  Cholesterol/Glucose:  Labs fine in November, including glucose and cholesterol.   Lipid Panel     Component Value Date/Time   CHOL 170 08/07/2022 1710   TRIG 78 08/07/2022 1710   HDL 47 08/07/2022 1710   CHOLHDL 3.3 10/04/2014 0953   VLDL 16 10/04/2014 0953   LDLCALC 108 (H) 08/07/2022 1710   LABVLDL 15 08/07/2022 1710     6.  Immunizations: Has not had shingles vaccination and basically states he is done with vaccines.  Unable to have a conversation about this.    Immunization History  Administered Date(s) Administered   Covid-19, Mrna,Vaccine(Spikevax)24yrs and older 08/07/2022   Influenza Inj Mdck Quad Pf 06/30/2018   Tdap 06/11/2017     Current Meds  Medication Sig   ibuprofen (ADVIL) 200 MG tablet Take 2 tablets by mouth every 6 (six) hours as needed. Takes two tabs every 6 hours as needed   No Known Allergies  Past Medical History:  Diagnosis Date   Elevated PSA 2020   Low back pain 68 yo   Past Surgical History:  Procedure Laterality Date   APPENDECTOMY Right 2007   BREAST SURGERY Bilateral 1995   Developed breasts as teenager.  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  Tobacco Use   Smoking status: Never    Passive exposure: Never   Smokeless tobacco: Never  Vaping Use   Vaping Use: Never used  Substance and Sexual Activity   Alcohol use: Yes    Alcohol/week: 0.0 standard drinks of alcohol    Comment: Rare glass of wine--states he itches after drinking, so does not use very often.   Drug use: No   Sexual activity: Yes  Other Topics Concern   Not on file  Social History Narrative   Originally from [place]   Taught in elementary school--all subjects.     No formal schooling to be a Pharmacist, hospital.   [place]ame [place] In [place] originally as a tourist   Lives with a "new wife"  In [place].   The two of them live together   Divorced from wife in [place]  Social Determinants of Health   Financial Resource Strain: Low Risk  (12/06/2022)   Overall Financial Resource Strain (CARDIA)    Difficulty of Paying Living Expenses: Not hard at all  Food Insecurity: No Food Insecurity (12/06/2022)   Hunger Vital Sign    Worried About Running Out of Food in the Last Year: Never true    Ran Out of Food in the Last Year: Never true  Transportation Needs: No Transportation Needs (12/06/2022)   PRAPARE - Hydrologist (Medical): No    Lack of Transportation (Non-Medical): No  Physical Activity: Inactive (10/14/2018)   Exercise Vital Sign    Days of Exercise per Week: 0 days    Minutes of Exercise per Session: 0 min  Stress: Not on file  Social Connections: Somewhat Isolated (10/14/2018)   Social Connection and Isolation Panel [NHANES]    Frequency of Communication with Friends and Family: More than three times a week    Frequency of Social  Gatherings with Friends and Family: Twice a week    Attends Religious Services: More than 4 times per year    Active Member of [employer] or Organizations: No    Attends Archivist Meetings: Never    Marital Status: Separated  Intimate Partner Violence: Not At Risk (12/06/2022)   Humiliation, Afraid, Rape, and Kick questionnaire    Fear of Current or Ex-Partner: No    Emotionally Abused: No    Physically Abused: No    Sexually Abused: No      Review of Systems  Respiratory:  Negative for shortness of breath.   Cardiovascular:  Negative for chest pain.  Musculoskeletal:  Positive for back pain.  Neurological:  Positive for headaches (Awakened last week with a headache on top of head.  Vomited.  Felt felt like it would explode.  His head was very hot and had to cover his head.  No photophobia..  Has less severe headaches occasionally throughout the year.  Denies associated stress.).      Objective:   BP (!) 156/96 (BP Location: Right Arm, Patient Position: Sitting, Cuff Size: Normal)   Pulse 72   Resp 16   Ht 5' 8.5" (1.74 m)   Wt 195 lb (88.5 kg)   BMI 29.22 kg/m   Physical Exam HENT:     Head: Normocephalic and atraumatic.     Right Ear: Tympanic membrane, ear canal and external ear normal.     Left Ear: Tympanic membrane, ear canal and external ear normal.     Nose: Nose normal.     Mouth/Throat:     Mouth: Mucous membranes are moist.     Pharynx: Oropharynx is clear.     Comments: Significant dental decay. Eyes:     Conjunctiva/sclera: Conjunctivae normal.     Pupils: Pupils are equal, round, and reactive to light.     Comments: Unable to see discs well as pupils small  Neck:     Thyroid: No thyroid mass or thyromegaly.  Cardiovascular:     Rate and Rhythm: Normal rate and regular rhythm.     Heart sounds: S1 normal and S2 normal. No murmur heard.    No friction rub. No S3 or S4 sounds.     Comments: No carotid bruits.  Carotid, radial, femoral, DP and PT  pulses normal and equal.   Pulmonary:     Effort: Pulmonary effort is normal.     Breath sounds: Normal breath sounds and air entry.  Abdominal:     General: Abdomen is flat. Bowel sounds are normal.     Palpations: Abdomen is soft. There is no hepatomegaly, splenomegaly or mass.     Tenderness: There is no abdominal tenderness.     Hernia: No hernia is present. There is no hernia in the left inguinal area or right inguinal area.  Genitourinary:    Penis: Normal and circumcised.      Testes:        Right: Mass or tenderness not present. Right testis is descended.        Left: Mass or tenderness not present. Left testis is descended.  Musculoskeletal:        General: Normal range of motion.     Cervical back: Normal range of motion and neck supple.     Right lower leg: No edema.     Left lower leg: No edema.  Lymphadenopathy:     Head:     Right side of head: No submental or submandibular adenopathy.     Left side of head: No submental or submandibular adenopathy.     Cervical: No cervical adenopathy.     Upper Body:     Right upper body: No supraclavicular adenopathy.     Left upper body: No supraclavicular adenopathy.     Lower Body: No right inguinal adenopathy. No left inguinal adenopathy.  Skin:    General: Skin is warm.     Findings: No rash.     Comments: Subcutaneous lumpy soft tissue mass approximately 5 by 3 cm at anterior base of right neck  Very moveable and nontender.  Nailbeds all dark (states since a child)  Neurological:     General: No focal deficit present.     Mental Status: He is alert.     Cranial Nerves: Cranial nerves 2-12 are intact.     Sensory: Sensation is intact.     Motor: Motor function is intact.     Coordination: Coordination is intact.     Gait: Gait is intact.     Deep Tendon Reflexes: Reflexes are normal and symmetric.  Psychiatric:        Behavior: Behavior normal.      Assessment & Plan   CPE Refused Shingrix.   Return FIT in 2  weeks.  2. Elevated PSA--continues to increase and patient continues to not follow through with orange card application.  Discussed no matter where I send him, he will need to apply for financial assistance and am concerned he is allowing possible prostate cancer to spread, making it harder to treat.   Call into Sande Rives at [facility] to see if can expedite.  Not clear if she was notified in November.   Will also ask SW to get involved and make sure he applies to get him to urology.  3.  Headache and elevated BP.  Start Lisinopril 10 mg daily and follow up in 1 week for BP and pulse check and BMP.  To bring in FIT then as well.

## 2022-12-11 ENCOUNTER — Other Ambulatory Visit (INDEPENDENT_AMBULATORY_CARE_PROVIDER_SITE_OTHER): Payer: Self-pay

## 2022-12-11 DIAGNOSIS — Z1211 Encounter for screening for malignant neoplasm of colon: Secondary | ICD-10-CM

## 2022-12-12 LAB — POC FIT TEST STOOL: Fecal Occult Blood: NEGATIVE

## 2022-12-20 ENCOUNTER — Other Ambulatory Visit: Payer: Self-pay

## 2022-12-20 VITALS — BP 150/92 | HR 60

## 2022-12-20 DIAGNOSIS — Z0130 Encounter for examination of blood pressure without abnormal findings: Secondary | ICD-10-CM

## 2022-12-20 DIAGNOSIS — Z79899 Other long term (current) drug therapy: Secondary | ICD-10-CM

## 2022-12-20 DIAGNOSIS — Z1159 Encounter for screening for other viral diseases: Secondary | ICD-10-CM

## 2022-12-20 NOTE — Progress Notes (Signed)
Patient reports that he is taking bp medication consistently. Patient did not take bp medication this morning.   Patient to return in 1 month for bp check.

## 2022-12-21 LAB — BASIC METABOLIC PANEL
BUN/Creatinine Ratio: 11 (ref 10–24)
BUN: 12 mg/dL (ref 8–27)
CO2: 26 mmol/L (ref 20–29)
Calcium: 8.9 mg/dL (ref 8.6–10.2)
Chloride: 106 mmol/L (ref 96–106)
Creatinine, Ser: 1.12 mg/dL (ref 0.76–1.27)
Glucose: 97 mg/dL (ref 70–99)
Potassium: 4.3 mmol/L (ref 3.5–5.2)
Sodium: 144 mmol/L (ref 134–144)
eGFR: 72 mL/min/{1.73_m2} (ref >59)

## 2022-12-21 LAB — HEPATITIS C ANTIBODY: Hep C Virus Ab: NONREACTIVE

## 2022-12-25 ENCOUNTER — Telehealth: Payer: Self-pay

## 2022-12-25 MED ORDER — LOSARTAN POTASSIUM 50 MG PO TABS: 50.0000 mg | ORAL_TABLET | Freq: Every day | ORAL | 11 refills | Status: DC

## 2022-12-25 NOTE — Telephone Encounter (Signed)
Patient called to report that immediately after starting lisinopril he began to have dry mouth and dry throat constantly. Issue is constant and has not changed. Patient has been taking Rx for about 2 weeks.   After reporting complaint to Dr Delrae Alfred, patient will stop lisinopril and will start on losartan  daily.

## 2023-01-04 ENCOUNTER — Telehealth: Payer: Self-pay

## 2023-01-04 NOTE — Telephone Encounter (Signed)
Patient called to report that he is not taking losartan because it gives him a headache. Headache happen immediately after taking it and every time he takes it. After he stopped taking the medication, he stopped having headaches.

## 2023-01-08 ENCOUNTER — Other Ambulatory Visit: Payer: Self-pay

## 2023-01-08 MED ORDER — AMLODIPINE BESYLATE 10 MG PO TABS: 10.0000 mg | ORAL_TABLET | Freq: Every day | ORAL | 11 refills | Status: DC

## 2023-01-08 NOTE — Telephone Encounter (Signed)
Patient has been notified of new Rx and has been scheduled for bp check

## 2023-01-08 NOTE — Telephone Encounter (Signed)
Lisinopril has been discontinued and amlodipine has been sent to the pharmacy.

## 2023-01-16 ENCOUNTER — Telehealth: Payer: Self-pay

## 2023-01-16 NOTE — Telephone Encounter (Signed)
Patient called stating that he wanted to talk about his BP Medicine. Patient said he be having headaches with the medicine and he stop taking them. Patient also said that he don't want no more prescriptions because they are not good for him because the headaches that he get are severe.

## 2023-01-25 ENCOUNTER — Ambulatory Visit: Payer: Self-pay

## 2023-01-25 VITALS — BP 160/100 | HR 64

## 2023-01-25 DIAGNOSIS — Z0130 Encounter for examination of blood pressure without abnormal findings: Secondary | ICD-10-CM

## 2023-02-01 NOTE — Progress Notes (Signed)
Patient has stopped taking medication due headaches after taking medication.  Patient has been instructed to take amlodipine 5mg  (cut amlodipine 10mg  in half).

## 2023-02-19 NOTE — Telephone Encounter (Signed)
Patient has already been seen and discussed about the medication.

## 2023-02-21 ENCOUNTER — Ambulatory Visit: Payer: Self-pay | Admitting: Internal Medicine

## 2023-02-21 ENCOUNTER — Encounter: Payer: Self-pay | Admitting: Internal Medicine

## 2023-02-21 VITALS — BP 150/86 | HR 68 | Resp 16 | Ht 68.5 in | Wt 199.0 lb

## 2023-02-21 DIAGNOSIS — I10 Essential (primary) hypertension: Secondary | ICD-10-CM

## 2023-02-21 DIAGNOSIS — R972 Elevated prostate specific antigen [PSA]: Secondary | ICD-10-CM

## 2023-02-21 NOTE — Patient Instructions (Signed)
Walk over and give report Tuesday morning before 10

## 2023-02-21 NOTE — Telephone Encounter (Signed)
Closing note 

## 2023-02-21 NOTE — Progress Notes (Signed)
    Subjective:    Patient ID: Joshua Calhoun, male   DOB: [birth year], 67 y.o.   MRN: [id number]   HPI   Hypertension:  tolerating the 5 mg daily of Amlodipine.  Feels his nasal passages were blocked with the 10 mg.  Discussed he possibly was suffering allergy symptoms back in May when started on the Amlodipine again.  2.  Elevated PSA:  More than doubled this past November with low % free, which places him at a higher risk for prostate cancer.    Current Meds  Medication Sig   amLODipine (NORVASC) 10 MG tablet Take 1 tablet (10 mg total) by mouth daily. (Patient taking differently: Take 5 mg by mouth daily.)   ibuprofen (ADVIL) 200 MG tablet Take 2 tablets by mouth every 6 (six) hours as needed. Takes two tabs every 6 hours as needed     No Known Allergies   Review of Systems    Objective:   BP (!) 150/86 (BP Location: Right Arm, Patient Position: Sitting, Cuff Size: Normal)   Pulse 68   Resp 16   Ht 5' 8.5" (1.74 m)   Wt 199 lb (90.3 kg)   BMI 29.82 kg/m   Physical Exam NAD Lungs:  CTA CV:  RRR without murmur or rub.  Radial and DP pulses normal and equal LE:  No Edema.   Assessment & Plan   Hypertension:  He is willing to increase Amlodipine now up to 10 mg and will see if any problematic side effects.  To call if so.  2.  Rising PSA with concerning free portion:  He now has an ID, which should allow him to proceed with orange card application.  Referral to Morene Antu, [place], who has been working with him on the application process.  Needs to get this done ASAP

## 2023-02-26 ENCOUNTER — Other Ambulatory Visit: Payer: Self-pay

## 2023-02-26 VITALS — BP 132/78 | HR 76

## 2023-02-26 DIAGNOSIS — Z0130 Encounter for examination of blood pressure without abnormal findings: Secondary | ICD-10-CM

## 2023-02-26 NOTE — Progress Notes (Signed)
He is having headaches again since increasing his Amlodipine to 1 mg daily.   As his BP is at least at goal, have asked him to take Tylenol for headaches as needed, not to wait until severe as his body gets used to a lower BP.

## 2023-02-26 NOTE — Progress Notes (Signed)
Patient reported that he has been taking bp medication consistently. 

## 2023-03-15 ENCOUNTER — Ambulatory Visit: Payer: Self-pay

## 2023-03-15 VITALS — BP 140/70 | HR 68

## 2023-03-15 DIAGNOSIS — Z0130 Encounter for examination of blood pressure without abnormal findings: Secondary | ICD-10-CM

## 2023-03-15 NOTE — Progress Notes (Signed)
Patient reported that he has been taking half tab of amlodipine daily. He reports that full 10mg  tab causes headaches. Patient took 10mg  tab for about 1 week before switching to half tab.     Return in 4 months for bp.

## 2023-07-18 ENCOUNTER — Ambulatory Visit: Payer: Self-pay | Admitting: Internal Medicine

## 2023-07-18 ENCOUNTER — Encounter: Payer: Self-pay | Admitting: Internal Medicine

## 2023-07-18 VITALS — BP 138/88 | HR 72 | Resp 16 | Ht 68.5 in | Wt 197.0 lb

## 2023-07-18 DIAGNOSIS — Z23 Encounter for immunization: Secondary | ICD-10-CM

## 2023-07-18 DIAGNOSIS — B351 Tinea unguium: Secondary | ICD-10-CM

## 2023-07-18 DIAGNOSIS — R972 Elevated prostate specific antigen [PSA]: Secondary | ICD-10-CM

## 2023-07-18 DIAGNOSIS — Z13220 Encounter for screening for lipoid disorders: Secondary | ICD-10-CM

## 2023-07-18 DIAGNOSIS — I10 Essential (primary) hypertension: Secondary | ICD-10-CM | POA: Insufficient documentation

## 2023-07-18 NOTE — Progress Notes (Signed)
Subjective:    Patient ID: Joshua Calhoun, male   DOB: 1956-08-18, 67 y.o.   MRN: [id number]   HPI   Hypertension:  Is taking the Amlodipine regularly now and bp has been better controlled.  He is tolerating it better, bur feels it causes dry mouth.  2.  Elevated PSA with higher risk free PSA.  Has been unwilling to get orange card to go to Urology.  We have tried to work with him and he fails to follow up.  Asked him today if he just does not want the care and is fine with the probability of prostate cancer based on labs, to just share that with me and we can stop pursuing. Ultimately, he once again states the problem is he does not have the documentation requested by the orange card.     Work hours: 10 a.m. to  6 pm.  Off on Thurs and Sun.  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. Letter from working place--not a registered job.  Discussed he can write his own self declaration letter--state how much he makes and signs for proof of income.   Has up to date [facility] ID.  Also has a passport.   He has a bill with his name on it.  3.  HM:  Has not had influenza, COvID, Shingles or pneumococcal vaccination.  Has not been able to afford colonoscopy.  FIT negative 12/2022.    Current Meds  Medication Sig   amLODipine (NORVASC) 10 MG tablet Take 1 tablet (10 mg total) by mouth daily.   Cyanocobalamin (B-12 PO) Take 1 tablet by mouth daily.   ibuprofen (ADVIL) 200 MG tablet Take 2 tablets by mouth every 6 (six) hours as needed. Takes two tabs every 6 hours as needed   No Known Allergies   Review of Systems    Objective:   BP 138/88 (BP Location: Right Arm, Patient Position: Sitting, Cuff Size: Normal)   Pulse 72   Resp 16   Ht 5' 8.5" (1.74 m)   Wt 197 lb (89.4 kg)   BMI 29.52 kg/m   Physical Exam NAD HEENT:  PERRL, EOMI, Left TM pearly gray.  Right appears scarred.  Throat without injection.  Significant wear on chewing surface of teeth.   Neck:  Supple, No adenopathy, no  thyromegaly Chest:  CTA CV:  RRR with normal S1 and S2, No S3 or S4.  No carotid bruits,  Carotid, radial and DP pulses normal and equal Abd:  S, NT, No HSM or mass, + BS LE:  No edema.  Toenails diffusely thickened and discolored.   Assessment & Plan  1.  Elevated PSA with concerning free PSA%.  Mayer Camel, office manager, sat with [place] today and explained he could sign his own documentation regarding pay.  I wrote down all of the documents he needs to bring to sign up next Thursday.   He lives next door to the office where sign up is each month.  He seems to be interested in getting this addressed finally. Will see if he has actual follow through.  Discussed if he has transportation issues to Urology, we can help him find a way to get there. Repeat PSA and free  2.  Hypertension:  Much better control and finally seems to be tolerating his amlodipine with slow increase in dose. CMP, CBC  3.  HM:  Influenza and CoVID vaccines today.  Return in 2weeks for pneumococcal 20 and Shingrix #1/2  4.  Toenail onychomycosis:  asymptomatic and no interest in treatment.  2.

## 2023-07-19 LAB — COMPREHENSIVE METABOLIC PANEL
ALT: 28 [IU]/L (ref 0–44)
AST: 31 [IU]/L (ref 0–40)
Albumin: 4.4 g/dL (ref 3.9–4.9)
Alkaline Phosphatase: 94 [IU]/L (ref 44–121)
BUN/Creatinine Ratio: 12 (ref 10–24)
BUN: 13 mg/dL (ref 8–27)
Bilirubin Total: 0.9 mg/dL (ref 0.0–1.2)
CO2: 26 mmol/L (ref 20–29)
Calcium: 9.1 mg/dL (ref 8.6–10.2)
Chloride: 103 mmol/L (ref 96–106)
Creatinine, Ser: 1.07 mg/dL (ref 0.76–1.27)
Globulin, Total: 3.3 g/dL (ref 1.5–4.5)
Glucose: 97 mg/dL (ref 70–99)
Potassium: 4.1 mmol/L (ref 3.5–5.2)
Sodium: 141 mmol/L (ref 134–144)
Total Protein: 7.7 g/dL (ref 6.0–8.5)
eGFR: 76 mL/min/{1.73_m2} (ref >59)

## 2023-07-19 LAB — CBC WITH DIFFERENTIAL/PLATELET
Basophils Absolute: 0 10*3/uL (ref 0.0–0.2)
Basos: 1 %
EOS (ABSOLUTE): 0.1 10*3/uL (ref 0.0–0.4)
Eos: 3 %
Hematocrit: 45.9 % (ref 37.5–51.0)
Hemoglobin: 15 g/dL (ref 13.0–17.7)
Immature Grans (Abs): 0 10*3/uL (ref 0.0–0.1)
Immature Granulocytes: 0 %
Lymphocytes Absolute: 1.8 10*3/uL (ref 0.7–3.1)
Lymphs: 47 %
MCH: 30.5 pg (ref 26.6–33.0)
MCHC: 32.7 g/dL (ref 31.5–35.7)
MCV: 93 fL (ref 79–97)
Monocytes Absolute: 0.4 10*3/uL (ref 0.1–0.9)
Monocytes: 11 %
Neutrophils Absolute: 1.4 10*3/uL (ref 1.4–7.0)
Neutrophils: 38 %
Platelets: 213 10*3/uL (ref 150–450)
RBC: 4.92 x10E6/uL (ref 4.14–5.80)
RDW: 11.8 % (ref 11.6–15.4)
WBC: 3.7 10*3/uL (ref 3.4–10.8)

## 2023-07-19 LAB — PSA, TOTAL AND FREE
PSA, Free Pct: 10.4 %
PSA, Free: 1.34 ng/mL
Prostate Specific Ag, Serum: 12.9 ng/mL — ABNORMAL HIGH (ref 0.0–4.0)

## 2023-07-19 LAB — LIPID PANEL W/O CHOL/HDL RATIO
Cholesterol, Total: 187 mg/dL (ref 100–199)
HDL: 47 mg/dL (ref >39)
LDL Chol Calc (NIH): 126 mg/dL — ABNORMAL HIGH (ref 0–99)
Triglycerides: 76 mg/dL (ref 0–149)
VLDL Cholesterol Cal: 14 mg/dL (ref 5–40)

## 2023-08-01 ENCOUNTER — Ambulatory Visit: Payer: Self-pay | Admitting: Internal Medicine

## 2023-08-01 DIAGNOSIS — Z23 Encounter for immunization: Secondary | ICD-10-CM

## 2023-09-16 ENCOUNTER — Other Ambulatory Visit: Payer: Self-pay

## 2023-09-18 ENCOUNTER — Other Ambulatory Visit: Payer: Self-pay

## 2023-09-18 DIAGNOSIS — R972 Elevated prostate specific antigen [PSA]: Secondary | ICD-10-CM

## 2023-09-19 ENCOUNTER — Telehealth: Payer: Self-pay | Admitting: Internal Medicine

## 2023-09-19 DIAGNOSIS — R972 Elevated prostate specific antigen [PSA]: Secondary | ICD-10-CM

## 2023-09-19 LAB — PSA: Prostate Specific Ag, Serum: 17.1 ng/mL — ABNORMAL HIGH (ref 0.0–4.0)

## 2023-09-20 NOTE — Telephone Encounter (Signed)
 Order has been sent to [facility] attention to [place], and patient has been informed.

## 2023-09-20 NOTE — Telephone Encounter (Signed)
 Spoke with Judeth Cornfield from [facility] per Dr. Renne Crigler request. Asked about patient's MRI referral that was requested by Urology. Judeth Cornfield stated that Dr. Delrae Alfred can do the MRI referral and fax it to her , she will send it to be schedule for this month.

## 2023-09-27 NOTE — Telephone Encounter (Signed)
New information that [facility] cannot get MR of prostate with and without contrast with [facility]. Will reroute order to [facility] and have patient apply for [facility] financial assistance. Jacqlyn Larsen will notify [facility].

## 2023-12-05 ENCOUNTER — Other Ambulatory Visit: Payer: Self-pay

## 2023-12-05 ENCOUNTER — Ambulatory Visit (HOSPITAL_COMMUNITY)
Admission: RE | Admit: 2023-12-05 | Discharge: 2023-12-05 | Disposition: A | Discharge status: Home / Self Care | Payer: Self-pay | Source: Ambulatory Visit | Attending: Internal Medicine | Admitting: Internal Medicine

## 2023-12-05 DIAGNOSIS — R972 Elevated prostate specific antigen [PSA]: Secondary | ICD-10-CM | POA: Insufficient documentation

## 2023-12-05 DIAGNOSIS — I10 Essential (primary) hypertension: Secondary | ICD-10-CM

## 2023-12-05 DIAGNOSIS — Z13220 Encounter for screening for lipoid disorders: Secondary | ICD-10-CM

## 2023-12-05 MED ORDER — GADOBUTROL 1 MMOL/ML IV SOLN
9.0000 mL | Freq: Once | INTRAVENOUS | Status: AC | PRN
  Administered 2023-12-05: 9 mL via INTRAVENOUS

## 2023-12-06 LAB — CBC WITH DIFFERENTIAL/PLATELET
Basophils Absolute: 0 10*3/uL (ref 0.0–0.2)
Basos: 1 %
EOS (ABSOLUTE): 0.1 10*3/uL (ref 0.0–0.4)
Eos: 2 %
Hematocrit: 45.7 % (ref 37.5–51.0)
Hemoglobin: 14.8 g/dL (ref 13.0–17.7)
Immature Grans (Abs): 0 10*3/uL (ref 0.0–0.1)
Immature Granulocytes: 0 %
Lymphocytes Absolute: 2 10*3/uL (ref 0.7–3.1)
Lymphs: 46 %
MCH: 30.1 pg (ref 26.6–33.0)
MCHC: 32.4 g/dL (ref 31.5–35.7)
MCV: 93 fL (ref 79–97)
Monocytes Absolute: 0.4 10*3/uL (ref 0.1–0.9)
Monocytes: 9 %
Neutrophils Absolute: 1.8 10*3/uL (ref 1.4–7.0)
Neutrophils: 42 %
Platelets: 200 10*3/uL (ref 150–450)
RBC: 4.91 x10E6/uL (ref 4.14–5.80)
RDW: 12.2 % (ref 11.6–15.4)
WBC: 4.3 10*3/uL (ref 3.4–10.8)

## 2023-12-06 LAB — LIPID PANEL W/O CHOL/HDL RATIO
Cholesterol, Total: 181 mg/dL (ref 100–199)
HDL: 43 mg/dL (ref >39)
LDL Chol Calc (NIH): 120 mg/dL — ABNORMAL HIGH (ref 0–99)
Triglycerides: 100 mg/dL (ref 0–149)
VLDL Cholesterol Cal: 18 mg/dL (ref 5–40)

## 2023-12-06 LAB — COMPREHENSIVE METABOLIC PANEL WITH GFR
ALT: 21 IU/L (ref 0–44)
AST: 21 IU/L (ref 0–40)
Albumin: 4.2 g/dL (ref 3.9–4.9)
Alkaline Phosphatase: 98 IU/L (ref 44–121)
BUN/Creatinine Ratio: 8 — ABNORMAL LOW (ref 10–24)
BUN: 9 mg/dL (ref 8–27)
Bilirubin Total: 0.7 mg/dL (ref 0.0–1.2)
CO2: 26 mmol/L (ref 20–29)
Calcium: 8.9 mg/dL (ref 8.6–10.2)
Chloride: 103 mmol/L (ref 96–106)
Creatinine, Ser: 1.07 mg/dL (ref 0.76–1.27)
Globulin, Total: 2.9 g/dL (ref 1.5–4.5)
Glucose: 106 mg/dL — ABNORMAL HIGH (ref 70–99)
Potassium: 4.1 mmol/L (ref 3.5–5.2)
Sodium: 141 mmol/L (ref 134–144)
Total Protein: 7.1 g/dL (ref 6.0–8.5)
eGFR: 76 mL/min/{1.73_m2} (ref >59)

## 2023-12-09 ENCOUNTER — Encounter: Payer: Self-pay | Admitting: Internal Medicine

## 2023-12-09 ENCOUNTER — Ambulatory Visit: Payer: Self-pay | Admitting: Internal Medicine

## 2023-12-09 VITALS — BP 154/90 | HR 61 | Resp 16 | Ht 69.25 in | Wt 195.0 lb

## 2023-12-09 DIAGNOSIS — Z0000 Encounter for general adult medical examination without abnormal findings: Secondary | ICD-10-CM

## 2023-12-09 DIAGNOSIS — R221 Localized swelling, mass and lump, neck: Secondary | ICD-10-CM

## 2023-12-09 DIAGNOSIS — B351 Tinea unguium: Secondary | ICD-10-CM

## 2023-12-09 DIAGNOSIS — Z23 Encounter for immunization: Secondary | ICD-10-CM

## 2023-12-09 DIAGNOSIS — K0889 Other specified disorders of teeth and supporting structures: Secondary | ICD-10-CM

## 2023-12-09 DIAGNOSIS — R972 Elevated prostate specific antigen [PSA]: Secondary | ICD-10-CM

## 2023-12-09 DIAGNOSIS — R7303 Prediabetes: Secondary | ICD-10-CM

## 2023-12-09 DIAGNOSIS — I10 Essential (primary) hypertension: Secondary | ICD-10-CM

## 2023-12-09 MED ORDER — AMLODIPINE BESYLATE 5 MG PO TABS: ORAL_TABLET | ORAL | 11 refills | Status: AC

## 2023-12-09 NOTE — Progress Notes (Signed)
 Subjective:    Patient ID: Joshua Calhoun, male   DOB: 1956/02/21, 68 y.o.   MRN: [id number]   HPI  Here for Male CPE:  1.  STE:  Does not perform.  No family history of testicular cancer.    2.  PSA:  Elevated PSA, the most recent in January back up above 17.  Free PSA % has put him in higher risk category for prostate cancer and had 2 Category 4 lesions on MR of prostate on 12/05/2023.  He is not aware of a Urology follow up and we are calling to get that information.  Urology office notified of MR results last Friday.    3.  Guaiac Cards/FIT:  4/ 2024 and negative.   4.  Colonoscopy:  Never.  No family history of colon cancer.   5.  Cholesterol/Glucose:  Cholesterol fine, though would like to see HDL higher.  Discussed diet.  History of prediabetes and glucose slightly high at 106.  No A1C done with these labs.  In 2016 A1C was 5.9%.    Lipid Panel     Component Value Date/Time   CHOL 181 12/05/2023 0919   TRIG 100 12/05/2023 0919   HDL 43 12/05/2023 0919   CHOLHDL 3.3 10/04/2014 0953   VLDL 16 10/04/2014 0953   LDLCALC 120 (H) 12/05/2023 0919   LABVLDL 18 12/05/2023 0919     6.  Immunizations: Has not had 2nd Shingrix.  Immunization History  Administered Date(s) Administered   Fluad Trivalent(High Dose 65+) 07/18/2023   Hepatitis B, ADULT 04/25/2023   Influenza Inj Mdck Quad Pf 06/30/2018, 08/09/2022   Moderna Covid-19 Fall Seasonal Vaccine 69yrs & older 08/07/2022, 07/18/2023   Moderna Sars-Covid-2 Vaccination 01/04/2020, 02/01/2020, 09/19/2020   PNEUMOCOCCAL CONJUGATE-20 08/01/2023   Pneumococcal Polysaccharide-23 04/25/2023   Tdap 06/11/2017, 04/25/2023   Varicella 04/25/2023   Zoster Recombinant(Shingrix) 08/01/2023      7.  Other:  Hypertension:  stopped amlodipine about 1 month ago as he was having headaches.  Had headaches only for weeks before stopping.  He had been tolerating the medication at 10 mg for a while after starting at 5 mg and gradually  increasing to 10 mg.  States headaches resolved.  Has been taking African herbs instead.    8.  BPH symptoms:  Up at night 3 times to urinate.  Feels he has a good stream.  No hesitation.  Stopped Tamsulosin after 1 week as he felt it made his nose stuffy and could not sleep.  States the stuffiness went away after stopping.  Is notorious for stopping meds for what he feels are side effects.    Current Meds  Medication Sig   Cyanocobalamin (B-12 PO) Take 1 tablet by mouth daily.   ibuprofen (ADVIL) 200 MG tablet Take 2 tablets by mouth every 6 (six) hours as needed. Takes two tabs every 6 hours as needed   No Known Allergies   Review of Systems  HENT:  Positive for dental problem (Has not been to dentist since 2008 when teeth removed.). Negative for hearing loss.   Eyes:  Negative for visual disturbance (Reading glasses.).  Respiratory:  Negative for shortness of breath.   Cardiovascular:  Negative for chest pain, palpitations and leg swelling.  Gastrointestinal:  Negative for abdominal pain and blood in stool (No melena.).  Genitourinary:  Positive for frequency (at night--3 times.).  Musculoskeletal:  Negative for arthralgias.  Skin:        Itchy skin lesions that come  and go.  Uses Cerave every day.   Gets bleeding under skin on legs in particular when scratches.    Neurological:  Negative for weakness and numbness.      Objective:   BP (!) 154/90 (BP Location: Left Arm, Patient Position: Sitting, Cuff Size: Normal)   Pulse 61   Resp 16   Ht 5' 9.25" (1.759 m)   Wt 195 lb (88.5 kg)   BMI 28.59 kg/m   Physical Exam Constitutional:      Appearance: Normal appearance.  HENT:     Head: Normocephalic and atraumatic.     Right Ear: Tympanic membrane, ear canal and external ear normal.     Left Ear: Tympanic membrane, ear canal and external ear normal.     Nose: Nose normal.     Mouth/Throat:     Mouth: Mucous membranes are moist.     Pharynx: Oropharynx is clear.      Comments: Missing teeth Eyes:     Extraocular Movements: Extraocular movements intact.     Conjunctiva/sclera: Conjunctivae normal.     Pupils: Pupils are equal, round, and reactive to light.     Comments: Discs sharp  Neck:     Thyroid: No thyroid mass or thyromegaly.     Comments: See lymph node exam at right anterior neck base Cardiovascular:     Rate and Rhythm: Normal rate and regular rhythm.     Heart sounds: S1 normal and S2 normal. No murmur heard.    No friction rub. No S3 or S4 sounds.     Comments: No carotid bruits.  Carotid, radial, femoral, DP and PT pulses normal and equal.   Pulmonary:     Effort: Pulmonary effort is normal.     Breath sounds: Normal breath sounds and air entry.  Chest:     Comments: Scars about areola bilaterally Abdominal:     General: Abdomen is flat. Bowel sounds are normal.     Palpations: Abdomen is soft. There is no hepatomegaly, splenomegaly or mass.     Tenderness: There is no abdominal tenderness.     Hernia: No hernia is present.  Genitourinary:    Comments: Deferred to Urology Musculoskeletal:        General: Normal range of motion.     Cervical back: Normal range of motion and neck supple.     Right lower leg: No edema.     Left lower leg: No edema.  Feet:     Right foot:     Skin integrity: Dry skin present.     Toenail Condition: Right toenails are abnormally thick.     Left foot:     Skin integrity: Dry skin present.     Toenail Condition: Left toenails are abnormally thick.  Lymphadenopathy:     Head:     Right side of head: No submental or submandibular adenopathy.     Left side of head: No submental or submandibular adenopathy.     Cervical: No cervical adenopathy.     Upper Body:     Right upper body: No supraclavicular or axillary adenopathy.     Left upper body: No supraclavicular or axillary adenopathy.     Lower Body: No right inguinal adenopathy. No left inguinal adenopathy.     Comments: However, at base of  right anterior/lateral neck with very large lobulated soft tissue mass.  Very smooth, not attached to underlying tissue.  NT.  No fluctuance.    Skin:    General:  Skin is warm.     Capillary Refill: Capillary refill takes less than 2 seconds.          Comments: Deeper scarring of legs--states from where had Israel worm removed many years ago.   Small circular hyperpigmented scars over arms and legs.    Neurological:     General: No focal deficit present.     Mental Status: He is alert and oriented to person, place, and time.     Cranial Nerves: Cranial nerves 2-12 are intact.     Sensory: Sensation is intact.     Motor: Motor function is intact.     Coordination: Coordination is intact.     Gait: Gait is intact.     Deep Tendon Reflexes: Reflexes are normal and symmetric.  Psychiatric:        Mood and Affect: Mood normal.        Speech: Speech normal.        Behavior: Behavior normal. Behavior is cooperative.      Assessment & Plan   CPE FIT to return in 2 weeks. Shingrix #2/2 Return for influenza and COVID in fall  2.  Elevated PSA with concerning MR of prostate for cancer in 2 areas.  Urology aware and will speak with physician to get him back for discussion  3.  Hypertension:  not controlled as off medication again.  Willing to go back to 5 mg dosing of Amlodipine.  Encouraged him to call if he is having symptoms he believes are related to medication.  Repeat BP check in 1 month.  4.  BPH:  Does not want to take Tamsulosin.  5.  Prediabetes:  Add A1C to labs  6.  Dental pain:  dental referral  7.  Right neck mass:  referral to Gen Surgery.  Suspect a benign process, and patient has been resistant to evaluation.  Will see if surgery recommends at least a biopsy.

## 2023-12-11 LAB — SPECIMEN STATUS REPORT

## 2023-12-11 LAB — HGB A1C W/O EAG: Hgb A1c MFr Bld: 6 % — ABNORMAL HIGH (ref 4.8–5.6)

## 2023-12-16 ENCOUNTER — Other Ambulatory Visit: Payer: Self-pay

## 2023-12-16 DIAGNOSIS — Z1211 Encounter for screening for malignant neoplasm of colon: Secondary | ICD-10-CM

## 2023-12-16 LAB — POC FIT TEST STOOL: Fecal Occult Blood: NEGATIVE

## 2024-01-15 ENCOUNTER — Other Ambulatory Visit: Payer: Self-pay

## 2024-01-15 VITALS — BP 132/84 | HR 67

## 2024-01-15 DIAGNOSIS — Z0130 Encounter for examination of blood pressure without abnormal findings: Secondary | ICD-10-CM

## 2024-01-15 NOTE — Progress Notes (Signed)
 Patient reports taking medication daily in the morning Denies any headaches or dizziness

## 2024-03-14 ENCOUNTER — Ambulatory Visit (HOSPITAL_COMMUNITY)
Admission: EM | Admit: 2024-03-14 | Discharge: 2024-03-14 | Disposition: A | Discharge status: Home / Self Care | Payer: Self-pay | Attending: Internal Medicine | Admitting: Internal Medicine

## 2024-03-14 ENCOUNTER — Encounter (HOSPITAL_COMMUNITY): Payer: Self-pay | Admitting: *Deleted

## 2024-03-14 ENCOUNTER — Other Ambulatory Visit: Payer: Self-pay

## 2024-03-14 DIAGNOSIS — U071 COVID-19: Secondary | ICD-10-CM

## 2024-03-14 LAB — POC SARS CORONAVIRUS 2 AG -  ED: SARS Coronavirus 2 Ag: POSITIVE — AB

## 2024-03-14 MED ORDER — BENZONATATE 100 MG PO CAPS: 100.0000 mg | ORAL_CAPSULE | Freq: Three times a day (TID) | ORAL | 0 refills | Status: DC

## 2024-03-14 MED ORDER — PAXLOVID (300/100) 20 X 150 MG & 10 X 100MG PO TBPK
3.0000 | ORAL_TABLET | Freq: Two times a day (BID) | ORAL | 0 refills | Status: AC
Start: 2024-03-14 — End: 2024-03-19

## 2024-03-14 NOTE — ED Triage Notes (Signed)
 C/O generalized weakness, body aches, dry throat, HA, cough onset 2 days ago. Has taken Advil  Sinus.

## 2024-03-14 NOTE — Discharge Instructions (Signed)
 You have COVID-19 viral illness. Wear a mask for 5 days of symptoms. You may return to public within those 5 days as long as you do not have a fever. Wash hands frequently.  Take paxlovid  as prescribed. Start medicine tonight. The sooner the paxlovid  is started, the better it works in Public relations account executive.  Do not take your Flomax while taking Paxlovid  and for 3 days after finishing Paxlovid .  Your next dose of Flomax may be on March 23, 2024. Take your amlodipine  every other day for the next 7 days while you are taking paxlovid . Check BP before taking amlodipine  while you are taking paxlovid . Taking these medicines together can cause BP to lower, it is safe to take every other day.  Start taking amlodipine  daily again once you are done with Paxlovid  prescription.  - Take prescribed medicines to help with symptoms: tessalon  perles - Use over the counter medicines to help with symptoms as discussed: Advil  sinus, etc. - Two teaspoons of honey in warm water every 4-6 hours may help with throat pains - Humidifier in your room at night to help add water the air and soothe cough  If you develop any new or worsening symptoms or do not improve in the next 2 to 3 days, please return.  If your symptoms are severe, please go to the emergency room.  Follow-up with PCP as needed.

## 2024-03-14 NOTE — ED Provider Notes (Signed)
 [facility]    CSN: [id number] Arrival date & time: 03/14/24  1135      History   Chief Complaint Chief Complaint  Patient presents with   Weakness   Generalized Body Aches    HPI Joshua Calhoun is a 68 y.o. male.   Joshua Calhoun is a 68 y.o. male presenting for chief complaint of cough, nasal congestion, sore throat, weakness and Generalized Body Aches that started 2 days ago.  No recent sick contacts with similar symptoms.  Cough is dry nonproductive.  Sore throat is worsened by swallowing.  Reports chills at home without known documented fever.  Never smoker, denies history of asthma/COPD.  History of hypertension, takes amlodipine  daily.  Denies shortness of breath, chest pain, nausea, vomiting, leg swelling, abdominal pain, diarrhea, rash, and recent antibiotic or steroid use.  He is taking Advil  sinus cold medicine with some relief of symptoms.   Weakness   Past Medical History:  Diagnosis Date   Elevated PSA 2020   [place] worm disease    As a child--scarring of legs remains   Hypertension    Low back pain 68 yo   Prediabetes 2016    Patient Active Problem List   Diagnosis Date Noted   Primary hypertension 07/18/2023   Elevated PSA 08/08/2022   Neck mass 06/13/2018   Tinea pedis of both feet 06/13/2018   Onychomycosis of toenail 06/13/2018   Low back pain    Prediabetes 2016    Past Surgical History:  Procedure Laterality Date   APPENDECTOMY Right 2007   BREAST SURGERY Bilateral 1995   Developed breasts as teenager.  Had removed in [place] when age 57 yo       Home Medications    Prior to Admission medications   Medication Sig Start Date End Date Taking? Authorizing Provider  amLODipine  (NORVASC ) 5 MG tablet 1 tab by mouth daily 12/09/23  Yes Adella Norris, MD  benzonatate  (TESSALON ) 100 MG capsule Take 1 capsule (100 mg total) by mouth every 8 (eight) hours. 03/14/24  Yes Enedelia Dorna HERO, FNP  Cyanocobalamin (B-12 PO) Take 1  tablet by mouth daily.   Yes [provider]  nirmatrelvir/ritonavir (PAXLOVID , 300/100,) 20 x 150 MG & 10 x 100MG  TBPK Take 3 tablets by mouth 2 (two) times daily for 5 days. Patient GFR is greater than 60. Take nirmatrelvir (150 mg) two tablets twice daily for 5 days and ritonavir (100 mg) one tablet twice daily for 5 days. 03/14/24 03/19/24 Yes StanhopeDorna HERO, FNP  ibuprofen  (ADVIL ) 200 MG tablet Take 2 tablets by mouth every 6 (six) hours as needed. Takes two tabs every 6 hours as needed    [provider]  tamsulosin (FLOMAX) 0.4 MG CAPS capsule Take 0.4 mg by mouth daily. Patient not taking: Reported on 12/09/2023 09/06/23   [provider]    Family History Family History  Problem Relation Age of Onset   Alcohol abuse Brother    Heart disease Daughter 68       MI    Social History Social History   Tobacco Use   Smoking status: Never    Passive exposure: Never   Smokeless tobacco: Never  Vaping Use   Vaping status: Never Used  Substance Use Topics   Alcohol use: Not Currently   Drug use: No     Allergies   Patient has no known allergies.   Review of Systems Review of Systems  Neurological:  Positive for weakness.  Per  HPI   Physical Exam Triage Vital Signs ED Triage Vitals  Encounter Vitals Group     BP 03/14/24 1151 118/74     Girls Systolic BP Percentile --      Girls Diastolic BP Percentile --      Boys Systolic BP Percentile --      Boys Diastolic BP Percentile --      Pulse Rate 03/14/24 1151 80     Resp 03/14/24 1151 18     Temp 03/14/24 1151 98.6 F (37 C)     Temp Source 03/14/24 1151 Oral     SpO2 03/14/24 1151 96 %     Weight --      Height --      Head Circumference --      Peak Flow --      Pain Score 03/14/24 1153 0     Pain Loc --      Pain Education --      Exclude from Growth Chart --    No data found.  Updated Vital Signs BP 118/74   Pulse 80   Temp 98.6 F (37 C) (Oral)   Resp 18   SpO2 96%    Visual Acuity Right Eye Distance:   Left Eye Distance:   Bilateral Distance:    Right Eye Near:   Left Eye Near:    Bilateral Near:     Physical Exam Vitals and nursing note reviewed.  Constitutional:      Appearance: He is not ill-appearing or toxic-appearing.  HENT:     Head: Normocephalic and atraumatic.     Right Ear: Hearing, tympanic membrane, ear canal and external ear normal.     Left Ear: Hearing, tympanic membrane, ear canal and external ear normal.     Nose: Congestion present.     Mouth/Throat:     Lips: Pink.     Mouth: Mucous membranes are moist. No injury or oral lesions.     Dentition: Normal dentition.     Tongue: No lesions.     Pharynx: Oropharynx is clear. Uvula midline. No pharyngeal swelling, oropharyngeal exudate, posterior oropharyngeal erythema, uvula swelling or postnasal drip.     Tonsils: No tonsillar exudate.  Eyes:     General: Lids are normal. Vision grossly intact. Gaze aligned appropriately.     Extraocular Movements: Extraocular movements intact.     Conjunctiva/sclera: Conjunctivae normal.  Neck:     Trachea: Trachea and phonation normal.  Cardiovascular:     Rate and Rhythm: Normal rate and regular rhythm.     Heart sounds: Normal heart sounds, S1 normal and S2 normal.  Pulmonary:     Effort: Pulmonary effort is normal. No respiratory distress.     Breath sounds: Normal breath sounds and air entry. No wheezing, rhonchi or rales.     Comments: Speaking in full sentences without difficulty. Chest:     Chest wall: No tenderness.  Musculoskeletal:     Cervical back: Neck supple.  Lymphadenopathy:     Cervical: No cervical adenopathy.  Skin:    General: Skin is warm and dry.     Capillary Refill: Capillary refill takes less than 2 seconds.     Findings: No rash.  Neurological:     General: No focal deficit present.     Mental Status: He is alert and oriented to person, place, and time. Mental status is at baseline.     Cranial  Nerves: No dysarthria or facial asymmetry.  Psychiatric:  Mood and Affect: Mood normal.        Speech: Speech normal.        Behavior: Behavior normal.        Thought Content: Thought content normal.        Judgment: Judgment normal.      UC Treatments / Results  Labs (all labs ordered are listed, but only abnormal results are displayed) Labs Reviewed  POC SARS CORONAVIRUS 2 AG -  ED - Abnormal; Notable for the following components:      Result Value   SARS Coronavirus 2 Ag Positive (*)    All other components within normal limits    EKG   Radiology No results found.  Procedures Procedures (including critical care time)  Medications Ordered in UC Medications - No data to display  Initial Impression / Assessment and Plan / UC Course  I have reviewed the triage vital signs and the nursing notes.  Pertinent labs & imaging results that were available during my care of the patient were reviewed by me and considered in my medical decision making (see chart for details).   1.  COVID-19 virus infection COVID testing positive in clinic. Lungs clear, vitals hemodynamically stable, therefore deferred imaging of the chest.  Patient is a candidate for antiviral (Paxlovid ) given risk factors for severe COVID-19 illness.  Antiviral sent to pharmacy. Most recent GFR greater than 60 based on blood work from December 05, 2023.  Daily medications checked for interactions with [place] medication checker tool prior to prescribing.  Hold Flomax while taking antiviral and for 3 days after finishing Paxlovid , then resume after finished.  Amlodipine  every other day while taking Paxlovid .  Discussed most up to date CDC guidelines regarding quarantine and masking to prevent transmission.  Recommend supportive care for symptomatic relief as outlined in AVS.    Counseled patient on potential for adverse effects with medications prescribed/recommended today, strict ER and return-to-clinic  precautions discussed, patient verbalized understanding.    Final Clinical Impressions(s) / UC Diagnoses   Final diagnoses:  COVID-19 virus infection     Discharge Instructions      You have COVID-19 viral illness. Wear a mask for 5 days of symptoms. You may return to public within those 5 days as long as you do not have a fever. Wash hands frequently.  Take paxlovid  as prescribed. Start medicine tonight. The sooner the paxlovid  is started, the better it works in Public relations account executive.  Do not take your Flomax while taking Paxlovid  and for 3 days after finishing Paxlovid .  Your next dose of Flomax may be on March 23, 2024. Take your amlodipine  every other day for the next 7 days while you are taking paxlovid . Check BP before taking amlodipine  while you are taking paxlovid . Taking these medicines together can cause BP to lower, it is safe to take every other day.  Start taking amlodipine  daily again once you are done with Paxlovid  prescription.  - Take prescribed medicines to help with symptoms: tessalon  perles - Use over the counter medicines to help with symptoms as discussed: Advil  sinus, etc. - Two teaspoons of honey in warm water every 4-6 hours may help with throat pains - Humidifier in your room at night to help add water the air and soothe cough  If you develop any new or worsening symptoms or do not improve in the next 2 to 3 days, please return.  If your symptoms are severe, please go to the emergency room.  Follow-up with PCP  as needed.      ED Prescriptions     Medication Sig Dispense Auth. Provider   nirmatrelvir/ritonavir (PAXLOVID , 300/100,) 20 x 150 MG & 10 x 100MG  TBPK Take 3 tablets by mouth 2 (two) times daily for 5 days. Patient GFR is greater than 60. Take nirmatrelvir (150 mg) two tablets twice daily for 5 days and ritonavir (100 mg) one tablet twice daily for 5 days. 30 tablet Enedelia Going M, FNP   benzonatate  (TESSALON ) 100 MG capsule Take 1 capsule (100 mg  total) by mouth every 8 (eight) hours. 21 capsule Enedelia Going HERO, FNP      PDMP not reviewed this encounter.   Enedelia Going HERO, [place] 03/14/24 1250

## 2024-06-09 ENCOUNTER — Ambulatory Visit: Payer: Self-pay | Admitting: Internal Medicine

## 2024-07-20 ENCOUNTER — Ambulatory Visit: Payer: Self-pay | Admitting: Internal Medicine

## 2024-07-20 ENCOUNTER — Encounter: Payer: Self-pay | Admitting: Internal Medicine

## 2024-07-20 VITALS — BP 134/74 | HR 99 | Resp 18 | Ht 68.0 in | Wt 193.0 lb

## 2024-07-20 DIAGNOSIS — R21 Rash and other nonspecific skin eruption: Secondary | ICD-10-CM

## 2024-07-20 DIAGNOSIS — I10 Essential (primary) hypertension: Secondary | ICD-10-CM

## 2024-07-20 DIAGNOSIS — Z599 Problem related to housing and economic circumstances, unspecified: Secondary | ICD-10-CM | POA: Insufficient documentation

## 2024-07-20 DIAGNOSIS — Z23 Encounter for immunization: Secondary | ICD-10-CM

## 2024-07-20 MED ORDER — PERMETHRIN 5 % EX CREA: TOPICAL_CREAM | CUTANEOUS | 1 refills | Status: AC

## 2024-07-20 NOTE — Progress Notes (Signed)
    Subjective:    Patient ID: Joshua Calhoun, male   DOB: 21-Jan-1956, 68 y.o.   MRN: [id number]   HPI   Hypertension:  States he is taking amlodipine  5 mg daily daily.  He stopped the flomax as he states it caused a headache.  Has not tolerated 10 mg of amlodipine  in past nor lisinopril  and losartan  as causes dizziness.   Repeat BP today 134/74  2.  Pruritic bumps on skin:  Started on pretibial area of left lower leg in January of 2025 and has spread to arms and chest.  Starts as a smooth domed tiny bump with mild erythema.  He scratches until bleeds.  Denies lesions on low back or right leg.  No pets.   His wife sleeps in a different bed and does not have the same rash.  They do share chairs and sofas, however.   They do have roaches and we are working to get the entire complex addressed.      Current Meds  Medication Sig   amLODipine  (NORVASC ) 5 MG tablet 1 tab by mouth daily   Cyanocobalamin (B-12 PO) Take 1 tablet by mouth daily.   ibuprofen  (ADVIL ) 200 MG tablet Take 2 tablets by mouth every 6 (six) hours as needed. Takes two tabs every 6 hours as needed (Patient taking differently: Take 2 tablets by mouth 2 times daily at 12 noon and 4 pm. Takes two , one in the mornings and one before sleeping)   No Known Allergies   Review of Systems    Objective:   BP (!) 140/90 (BP Location: Right Arm, Patient Position: Sitting, Cuff Size: Normal)   Pulse 99   Resp 18   Ht 5' 8 (1.727 m)   Wt 193 lb (87.5 kg)   BMI 29.35 kg/m  Repeat BP:  134/74  Physical Exam HENT:     Head: Normocephalic and atraumatic.  Cardiovascular:     Rate and Rhythm: Normal rate and regular rhythm.     Pulses: Normal pulses.     Heart sounds: S1 normal and S2 normal. No murmur heard.    No friction rub. No S3 or S4 sounds.  Pulmonary:     Effort: Pulmonary effort is normal.     Breath sounds: Normal breath sounds and air entry.  Musculoskeletal:     Right lower leg: No edema.     Left lower  leg: No edema.  Skin:        Comments: 2-3 mm circular lesions where lesions scratched away.  Surrounding hyperpigmentation from scratching.  He has two smooth domed clear fluid appearing lesions on upper right arm.  No umbilication.  Circled areas where scratched of lesions. Lesions in different stages of healing/scarring.  Neurological:     Mental Status: He is alert.      Assessment & Plan   Hypertension:  controlled  2.  BPH: asymptomatic without Flomax.  He is followed by Urology for elevated PSA  3.  Skin lesions/rash:  most compatible with scabies for length of time.  Only two somewhat new lesions today to be able to assess.  No interdigital lesions or lesions about waist.  Will go ahead and treat with Elimite  5% cream.  Went over how to use twice 1 week apart and to wash all bedclothes and clothing and shower the next day.  4.  HM:  Fluad.  He declined COVID vaccinatioon

## 2024-07-20 NOTE — Patient Instructions (Signed)
 Walk over or call if rash not improved in 2 weeks

## 2024-12-07 ENCOUNTER — Other Ambulatory Visit: Payer: Self-pay

## 2024-12-10 ENCOUNTER — Encounter: Payer: Self-pay | Admitting: Internal Medicine
